# Patient Record
Sex: Female | Born: 1970 | Race: Black or African American | Hispanic: No | Marital: Married | Smoking: Never smoker
Health system: Southern US, Community
[De-identification: ages and names within clinical notes are randomized; demographics above are authoritative.]

## PROBLEM LIST (undated history)

## (undated) DIAGNOSIS — J45909 Unspecified asthma, uncomplicated: Secondary | ICD-10-CM

---

## 1998-08-25 ENCOUNTER — Emergency Department (HOSPITAL_COMMUNITY): Admission: EM | Admit: 1998-08-25 | Discharge: 1998-08-25 | Payer: Self-pay | Admitting: Endocrinology

## 1999-04-23 ENCOUNTER — Inpatient Hospital Stay (HOSPITAL_COMMUNITY): Admission: AD | Admit: 1999-04-23 | Discharge: 1999-04-23 | Payer: Self-pay | Admitting: Obstetrics & Gynecology

## 1999-04-23 ENCOUNTER — Encounter: Payer: Self-pay | Admitting: Obstetrics & Gynecology

## 1999-07-09 ENCOUNTER — Inpatient Hospital Stay (HOSPITAL_COMMUNITY): Admission: AD | Admit: 1999-07-09 | Discharge: 1999-07-09 | Payer: Self-pay | Admitting: Obstetrics and Gynecology

## 1999-10-01 ENCOUNTER — Inpatient Hospital Stay (HOSPITAL_COMMUNITY): Admission: AD | Admit: 1999-10-01 | Discharge: 1999-10-04 | Payer: Self-pay | Admitting: Obstetrics and Gynecology

## 2000-06-14 ENCOUNTER — Other Ambulatory Visit: Admission: RE | Admit: 2000-06-14 | Discharge: 2000-06-14 | Payer: Self-pay | Admitting: Internal Medicine

## 2001-08-15 ENCOUNTER — Other Ambulatory Visit: Admission: RE | Admit: 2001-08-15 | Discharge: 2001-08-15 | Payer: Self-pay | Admitting: Internal Medicine

## 2001-10-29 ENCOUNTER — Emergency Department (HOSPITAL_COMMUNITY): Admission: EM | Admit: 2001-10-29 | Discharge: 2001-10-29 | Payer: Self-pay | Admitting: Emergency Medicine

## 2003-03-28 ENCOUNTER — Other Ambulatory Visit: Admission: RE | Admit: 2003-03-28 | Discharge: 2003-03-28 | Payer: Self-pay | Admitting: Internal Medicine

## 2004-06-20 ENCOUNTER — Ambulatory Visit: Payer: Self-pay | Admitting: Endocrinology

## 2004-07-22 ENCOUNTER — Ambulatory Visit: Payer: Self-pay | Admitting: Internal Medicine

## 2004-08-06 ENCOUNTER — Ambulatory Visit: Payer: Self-pay | Admitting: Internal Medicine

## 2004-08-13 ENCOUNTER — Ambulatory Visit: Payer: Self-pay | Admitting: Internal Medicine

## 2004-08-19 ENCOUNTER — Other Ambulatory Visit: Admission: RE | Admit: 2004-08-19 | Discharge: 2004-08-19 | Payer: Self-pay | Admitting: Internal Medicine

## 2004-08-21 ENCOUNTER — Ambulatory Visit: Payer: Self-pay | Admitting: Internal Medicine

## 2004-10-02 ENCOUNTER — Ambulatory Visit: Payer: Self-pay | Admitting: Internal Medicine

## 2005-08-12 ENCOUNTER — Ambulatory Visit: Payer: Self-pay | Admitting: Internal Medicine

## 2007-04-07 ENCOUNTER — Encounter: Payer: Self-pay | Admitting: Internal Medicine

## 2013-08-23 ENCOUNTER — Encounter (HOSPITAL_COMMUNITY): Payer: Self-pay | Admitting: Emergency Medicine

## 2013-08-23 ENCOUNTER — Emergency Department (INDEPENDENT_AMBULATORY_CARE_PROVIDER_SITE_OTHER)
Admission: EM | Admit: 2013-08-23 | Discharge: 2013-08-23 | Disposition: A | Discharge status: Home / Self Care | Payer: BC Managed Care – PPO | Source: Home / Self Care | Attending: Family Medicine | Admitting: Family Medicine

## 2013-08-23 DIAGNOSIS — S161XXA Strain of muscle, fascia and tendon at neck level, initial encounter: Secondary | ICD-10-CM

## 2013-08-23 DIAGNOSIS — S139XXA Sprain of joints and ligaments of unspecified parts of neck, initial encounter: Secondary | ICD-10-CM

## 2013-08-23 MED ORDER — CYCLOBENZAPRINE HCL 5 MG PO TABS: 5.0000 mg | ORAL_TABLET | Freq: Every evening | ORAL | Status: DC | PRN

## 2013-08-23 MED ORDER — DICLOFENAC SODIUM 75 MG PO TBEC: 75.0000 mg | DELAYED_RELEASE_TABLET | Freq: Two times a day (BID) | ORAL | Status: DC | PRN
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
## 2013-08-28 NOTE — ED Notes (Signed)
Accessed record due to patient phone call

## 2013-09-04 ENCOUNTER — Ambulatory Visit: Payer: BC Managed Care – PPO | Attending: Family Medicine

## 2013-09-18 ENCOUNTER — Other Ambulatory Visit: Payer: Self-pay | Admitting: Family Medicine

## 2013-09-18 DIAGNOSIS — Z1231 Encounter for screening mammogram for malignant neoplasm of breast: Secondary | ICD-10-CM

## 2013-10-09 ENCOUNTER — Ambulatory Visit
Admission: RE | Admit: 2013-10-09 | Discharge: 2013-10-09 | Disposition: A | Discharge status: Home / Self Care | Payer: BC Managed Care – PPO | Source: Ambulatory Visit | Attending: Family Medicine | Admitting: Family Medicine

## 2013-10-09 DIAGNOSIS — Z1231 Encounter for screening mammogram for malignant neoplasm of breast: Secondary | ICD-10-CM

## 2013-10-17 ENCOUNTER — Encounter (HOSPITAL_COMMUNITY): Payer: Self-pay | Admitting: Emergency Medicine

## 2013-10-17 ENCOUNTER — Emergency Department (HOSPITAL_COMMUNITY)
Admission: EM | Admit: 2013-10-17 | Discharge: 2013-10-17 | Disposition: A | Discharge status: Home / Self Care | Payer: BC Managed Care – PPO | Attending: Emergency Medicine | Admitting: Emergency Medicine

## 2013-10-17 DIAGNOSIS — S0003XA Contusion of scalp, initial encounter: Secondary | ICD-10-CM | POA: Insufficient documentation

## 2013-10-17 DIAGNOSIS — Y92009 Unspecified place in unspecified non-institutional (private) residence as the place of occurrence of the external cause: Secondary | ICD-10-CM | POA: Insufficient documentation

## 2013-10-17 DIAGNOSIS — Y9389 Activity, other specified: Secondary | ICD-10-CM | POA: Insufficient documentation

## 2013-10-17 DIAGNOSIS — S0083XA Contusion of other part of head, initial encounter: Secondary | ICD-10-CM

## 2013-10-17 DIAGNOSIS — J45909 Unspecified asthma, uncomplicated: Secondary | ICD-10-CM | POA: Insufficient documentation

## 2013-10-17 DIAGNOSIS — W208XXA Other cause of strike by thrown, projected or falling object, initial encounter: Secondary | ICD-10-CM | POA: Insufficient documentation

## 2013-10-17 DIAGNOSIS — S1093XA Contusion of unspecified part of neck, initial encounter: Principal | ICD-10-CM

## 2013-10-17 HISTORY — DX: Unspecified asthma, uncomplicated: J45.909
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
## 2013-10-17 NOTE — ED Provider Notes (Signed)
CSN: [id number]     Arrival date & time 10/17/13  1925 History   First MD Initiated Contact with Patient 10/17/13 2034     Chief Complaint  Patient presents with  . Head Injury     (Consider location/radiation/quality/duration/timing/severity/associated sxs/prior Treatment) Patient is a 43 y.o. female presenting with head injury. The history is provided by the patient.  Head Injury  patient here after being struck on the left side of her face with a door. No loss of consciousness. Denies any nausea or vomiting. Denies any neck pain. No visual changes. Has not been confused since the accident. Does note mild swelling to her left zygomatic arch with an abrasion. No hearing issues. Pain characterized as sharp and worse with touch. No treatment used prior to arrival. Nothing makes her symptoms better.  Past Medical History  Diagnosis Date  . Asthma    History reviewed. No pertinent past surgical history. No family history on file. History  Substance Use Topics  . Smoking status: Never Smoker   . Smokeless tobacco: Not on file  . Alcohol Use: No   OB History   Grav Para Term Preterm Abortions TAB SAB Ect Mult Living                 Review of Systems  All other systems reviewed and are negative.      Allergies  Review of patient's allergies indicates no known allergies.  Home Medications  No current outpatient prescriptions on file. BP 111/73  Pulse 60  Temp(Src) 98.3 F (36.8 C) (Oral)  Resp 20  Ht 5' 6"  (1.676 m)  Wt 145 lb (65.772 kg)  BMI 23.41 kg/m2  SpO2 100%  LMP 09/24/2013 Physical Exam  Nursing note and vitals reviewed. Constitutional: She is oriented to person, place, and time. She appears well-developed and well-nourished.  Non-toxic appearance. No distress.  HENT:  Head: Normocephalic and atraumatic.    Eyes: Conjunctivae, EOM and lids are normal. Pupils are equal, round, and reactive to light.  Neck: Normal range of motion. Neck supple. No tracheal  deviation present. No mass present.  Cardiovascular: Normal rate, regular rhythm and normal heart sounds.  Exam reveals no gallop.   No murmur heard. Pulmonary/Chest: Effort normal and breath sounds normal. No stridor. No respiratory distress. She has no decreased breath sounds. She has no wheezes. She has no rhonchi. She has no rales.  Abdominal: Soft. Normal appearance and bowel sounds are normal. She exhibits no distension. There is no tenderness. There is no rebound and no CVA tenderness.  Musculoskeletal: Normal range of motion. She exhibits no edema and no tenderness.  Neurological: She is alert and oriented to person, place, and time. She has normal strength. No cranial nerve deficit or sensory deficit. GCS eye subscore is 4. GCS verbal subscore is 5. GCS motor subscore is 6.  Skin: Skin is warm and dry. No abrasion and no rash noted.  Psychiatric: She has a normal mood and affect. Her speech is normal and behavior is normal.    ED Course  Procedures (including critical care time) Labs Review Labs Reviewed - No data to display Imaging Review No results found.   EKG Interpretation None      MDM   Final diagnoses:  None    Patient without red flags for intracranial hemorrhage. Did discuss the possibility of doing a CT of her head which she has deferred. Stable for discharge    Leota Jacobsen, MD 10/17/13 2109

## 2013-10-17 NOTE — ED Notes (Signed)
Pt presents with c/o head injury. Pt says that she was working on some things in her house and a door fell onto her face. Pt says that she did not have any LOC, nausea, vomiting. Pt says she does not have any dizziness at this time. Pt does have some mild swelling to her left cheekbone at this time. Pt says she has had some trouble recalling her birthday today, is able to tell me but is slow on the recall.

## 2014-05-06 ENCOUNTER — Emergency Department (HOSPITAL_COMMUNITY): Payer: BC Managed Care – PPO

## 2014-05-06 ENCOUNTER — Encounter (HOSPITAL_COMMUNITY): Payer: Self-pay | Admitting: Emergency Medicine

## 2014-05-06 ENCOUNTER — Emergency Department (HOSPITAL_COMMUNITY)
Admission: EM | Admit: 2014-05-06 | Discharge: 2014-05-07 | Disposition: A | Discharge status: Home / Self Care | Payer: BC Managed Care – PPO | Attending: Emergency Medicine | Admitting: Emergency Medicine

## 2014-05-06 DIAGNOSIS — R5383 Other fatigue: Secondary | ICD-10-CM | POA: Diagnosis not present

## 2014-05-06 DIAGNOSIS — J45909 Unspecified asthma, uncomplicated: Secondary | ICD-10-CM | POA: Diagnosis not present

## 2014-05-06 DIAGNOSIS — H9209 Otalgia, unspecified ear: Secondary | ICD-10-CM | POA: Diagnosis not present

## 2014-05-06 DIAGNOSIS — J018 Other acute sinusitis: Secondary | ICD-10-CM | POA: Insufficient documentation

## 2014-05-06 DIAGNOSIS — M542 Cervicalgia: Secondary | ICD-10-CM | POA: Insufficient documentation

## 2014-05-06 DIAGNOSIS — Z79899 Other long term (current) drug therapy: Secondary | ICD-10-CM | POA: Insufficient documentation

## 2014-05-06 DIAGNOSIS — R05 Cough: Secondary | ICD-10-CM | POA: Diagnosis present

## 2014-05-06 DIAGNOSIS — R509 Fever, unspecified: Secondary | ICD-10-CM | POA: Insufficient documentation

## 2014-05-06 LAB — CBC WITH DIFFERENTIAL/PLATELET
Basophils Absolute: 0.1 10*3/uL (ref 0.0–0.1)
Basophils Relative: 1 % (ref 0–1)
EOS ABS: 0.1 10*3/uL (ref 0.0–0.7)
Eosinophils Relative: 2 % (ref 0–5)
HCT: 39.8 % (ref 36.0–46.0)
HEMOGLOBIN: 12.7 g/dL (ref 12.0–15.0)
Lymphocytes Relative: 30 % (ref 12–46)
Lymphs Abs: 1.6 10*3/uL (ref 0.7–4.0)
MCH: 28.4 pg (ref 26.0–34.0)
MCHC: 31.9 g/dL (ref 30.0–36.0)
MCV: 89 fL (ref 78.0–100.0)
MONO ABS: 0.5 10*3/uL (ref 0.1–1.0)
MONOS PCT: 9 % (ref 3–12)
Neutro Abs: 3.1 10*3/uL (ref 1.7–7.7)
Neutrophils Relative %: 58 % (ref 43–77)
Platelets: 347 10*3/uL (ref 150–400)
RBC: 4.47 MIL/uL (ref 3.87–5.11)
RDW: 13.3 % (ref 11.5–15.5)
WBC: 5.3 10*3/uL (ref 4.0–10.5)

## 2014-05-06 LAB — I-STAT CG4 LACTIC ACID, ED: Lactic Acid, Venous: 1.81 mmol/L (ref 0.5–2.2)

## 2014-05-06 LAB — COMPREHENSIVE METABOLIC PANEL
ALBUMIN: 4.2 g/dL (ref 3.5–5.2)
ALT: 15 U/L (ref 0–35)
ANION GAP: 12 (ref 5–15)
AST: 20 U/L (ref 0–37)
Alkaline Phosphatase: 48 U/L (ref 39–117)
BUN: 9 mg/dL (ref 6–23)
CALCIUM: 9.6 mg/dL (ref 8.4–10.5)
CO2: 25 mEq/L (ref 19–32)
CREATININE: 0.69 mg/dL (ref 0.50–1.10)
Chloride: 101 mEq/L (ref 96–112)
GFR calc Af Amer: >90 mL/min (ref >90)
GFR calc non Af Amer: >90 mL/min (ref >90)
Glucose, Bld: 97 mg/dL (ref 70–99)
Potassium: 3.9 mEq/L (ref 3.7–5.3)
Sodium: 138 mEq/L (ref 137–147)
TOTAL PROTEIN: 7.5 g/dL (ref 6.0–8.3)
Total Bilirubin: 0.2 mg/dL — ABNORMAL LOW (ref 0.3–1.2)

## 2014-05-06 MED ORDER — SODIUM CHLORIDE 0.9 % IV BOLUS (SEPSIS)
1000.0000 mL | Freq: Once | INTRAVENOUS | Status: AC
  Administered 2014-05-06: 1000 mL via INTRAVENOUS

## 2014-05-06 MED ORDER — HYDROCOD POLST-CHLORPHEN POLST 10-8 MG/5ML PO LQCR
5.0000 mL | Freq: Once | ORAL | Status: AC
  Administered 2014-05-07: 5 mL via ORAL
  Filled 2014-05-06: qty 5

## 2014-05-06 MED ORDER — PSEUDOEPHEDRINE HCL ER 120 MG PO TB12: 120.0000 mg | ORAL_TABLET | Freq: Two times a day (BID) | ORAL | Status: DC

## 2014-05-06 MED ORDER — HYDROCOD POLST-CHLORPHEN POLST 10-8 MG/5ML PO LQCR: 5.0000 mL | Freq: Two times a day (BID) | ORAL | Status: DC | PRN

## 2014-05-06 MED ORDER — MORPHINE SULFATE 4 MG/ML IJ SOLN
4.0000 mg | Freq: Once | INTRAMUSCULAR | Status: AC
Start: 2014-05-06 — End: 2014-05-06
  Administered 2014-05-06: 4 mg via INTRAVENOUS
  Filled 2014-05-06: qty 1

## 2014-05-06 NOTE — ED Provider Notes (Signed)
CSN: [id number]     Arrival date & time 05/06/14  1959 History   First MD Initiated Contact with Patient 05/06/14 2122     Chief Complaint  Patient presents with  . Neck Pain  . URI     (Consider location/radiation/quality/duration/timing/severity/associated sxs/prior Treatment) HPI Comments: Patient is a 43 year old female with history of asthma who presents the emergency department evaluation of congestion and generalized malaise over the past week. She reports her symtpoms are gradually worsening. She has associated productive cough which is worse at night. She reports fever of 54F on Friday. She takes NyQuil which helped her go to sleep at night. Her last dose of the medication was yesterday. She has associated decreased appetite, ear pain, and hoarse voice. She has associated neck pain which is worse with movement. She denies headache, visual disturbance, photophobia, rash.   The history is provided by the patient. No language interpreter was used.    Past Medical History  Diagnosis Date  . Asthma    Past Surgical History  Procedure Laterality Date  . Cesarean section     No family history on file. History  Substance Use Topics  . Smoking status: Never Smoker   . Smokeless tobacco: Not on file  . Alcohol Use: No   OB History   Grav Para Term Preterm Abortions TAB SAB Ect Mult Living                 Review of Systems  Constitutional: Positive for fever, chills, appetite change and fatigue.  HENT: Positive for congestion, ear pain and rhinorrhea. Negative for sore throat.   Eyes: Negative for photophobia and visual disturbance.  Respiratory: Positive for cough. Negative for shortness of breath.   Cardiovascular: Negative for chest pain.  Musculoskeletal: Positive for myalgias and neck pain.  Neurological: Negative for headaches.  All other systems reviewed and are negative.     Allergies  Review of patient's allergies indicates no known allergies.  Home  Medications   Prior to Admission medications   Medication Sig Start Date End Date Taking? Authorizing Provider  albuterol (PROVENTIL HFA;VENTOLIN HFA) 108 (90 BASE) MCG/ACT inhaler Inhale 1 puff into the lungs every 6 (six) hours as needed for wheezing or shortness of breath.   Yes Historical Provider, MD   BP 122/70  Pulse 78  Temp(Src) 98.1 F (36.7 C) (Oral)  Resp 20  Ht 5' 5"  (1.651 m)  Wt 150 lb (68.04 kg)  BMI 24.96 kg/m2  SpO2 99%  LMP 04/30/2014 Physical Exam  Nursing note and vitals reviewed. Constitutional: She is oriented to person, place, and time. She appears well-developed and well-nourished. No distress.  HENT:  Head: Normocephalic and atraumatic.  Right Ear: Tympanic membrane, external ear and ear canal normal. No mastoid tenderness.  Left Ear: Tympanic membrane, external ear and ear canal normal. No mastoid tenderness.  Nose: Right sinus exhibits maxillary sinus tenderness and frontal sinus tenderness. Left sinus exhibits maxillary sinus tenderness and frontal sinus tenderness.  Mouth/Throat: Uvula is midline, oropharynx is clear and moist and mucous membranes are normal.  No mastoid tenderness  Eyes: Conjunctivae and EOM are normal. Pupils are equal, round, and reactive to light.  Neck: Normal range of motion. No rigidity.  No nuchal rigidity or meningeal signs. Patient reports feeling pain, but freely moves head and neck around in circles.   Cardiovascular: Normal rate, regular rhythm and normal heart sounds.   Pulmonary/Chest: Effort normal and breath sounds normal. No stridor. No respiratory  distress. She has no wheezes. She has no rales.  Abdominal: Soft. She exhibits no distension. There is no tenderness.  Musculoskeletal: Normal range of motion.  Moves all extremities without guarding or ataxia.   Lymphadenopathy:    She has cervical adenopathy.       Right cervical: Superficial cervical and posterior cervical adenopathy present.       Left cervical:  Superficial cervical and posterior cervical adenopathy present.  Neurological: She is alert and oriented to person, place, and time. She has normal strength. Coordination and gait normal. GCS eye subscore is 4. GCS verbal subscore is 5. GCS motor subscore is 6.  Skin: Skin is warm and dry. She is not diaphoretic. No erythema.  Psychiatric: She has a normal mood and affect. Her behavior is normal.    ED Course  Procedures (including critical care time) Labs Review Labs Reviewed  COMPREHENSIVE METABOLIC PANEL - Abnormal; Notable for the following:    Total Bilirubin 0.2 (*)    All other components within normal limits  CBC WITH DIFFERENTIAL  I-STAT CG4 LACTIC ACID, ED    Imaging Review Dg Chest 2 View  05/06/2014   CLINICAL DATA:  Neck pain and URI.  Initial encounter  EXAM: CHEST  2 VIEW  COMPARISON:  None.  FINDINGS: Normal heart size and mediastinal contours. No acute infiltrate or edema. No effusion or pneumothorax. No acute osseous findings.  IMPRESSION: No active cardiopulmonary disease.   Electronically Signed   By: Jorje Guild M.D.   On: 05/06/2014 22:33     EKG Interpretation None      MDM   Final diagnoses:  Other acute sinusitis   Patient complaining of symptoms of sinusitis.  Mild to moderate symptoms of clear/yellow nasal discharge/congestion and scratchy throat with cough for less than 10 days.  Patient is afebrile.  No concern for acute bacterial rhinosinusitis; likely viral in nature.  Patient discharged with symptomatic treatment.  Patient instructions given for warm saline nasal washes.  Recommendations for follow-up with primary care physician.    Elwyn Lade, PA-C 05/08/14 [phone]

## 2014-05-06 NOTE — ED Notes (Signed)
Pt states that she has had congestion and not feeling well x 7 days; pt states that Friday she had a fever and began to have neck pain; pt able to bend head down but states it is painful; pt c/o generalized malaise; decreased appetite; congestion; ear pain; and hoarseness; pt states that she has been taking OTC medications without improvement of symptos

## 2014-05-06 NOTE — Discharge Instructions (Signed)
Sinusitis °Sinusitis is redness, soreness, and puffiness (inflammation) of the air pockets in the bones of your face (sinuses). The redness, soreness, and puffiness can cause air and mucus to get trapped in your sinuses. This can allow germs to grow and cause an infection.  °HOME CARE  °· Drink enough fluids to keep your pee (urine) clear or pale yellow. °· Use a humidifier in your home. °· Run a hot shower to create steam in the bathroom. Sit in the bathroom with the door closed. Breathe in the steam 3-4 times a day. °· Put a warm, moist washcloth on your face 3-4 times a day, or as told by your doctor. °· Use salt water sprays (saline sprays) to wet the thick fluid in your nose. This can help the sinuses drain. °· Only take medicine as told by your doctor. °GET HELP RIGHT AWAY IF:  °· Your pain gets worse. °· You have very bad headaches. °· You are sick to your stomach (nauseous). °· You throw up (vomit). °· You are very sleepy (drowsy) all the time. °· Your face is puffy (swollen). °· Your vision changes. °· You have a stiff neck. °· You have trouble breathing. °MAKE SURE YOU:  °· Understand these instructions. °· Will watch your condition. °· Will get help right away if you are not doing well or get worse. °Document Released: 12/30/2007 Document Revised: 04/06/2012 Document Reviewed: 02/16/2012 °[employer]® Patient Information ©2015 [employer]. This information is not intended to replace advice given to you by your health care provider. Make sure you discuss any questions you have with your health care provider. ° °

## 2014-05-06 NOTE — ED Notes (Signed)
Bed: UV75 Expected date:  Expected time:  Means of arrival:  Comments: Hold for [facility]

## 2014-05-07 MED ORDER — DEXAMETHASONE SODIUM PHOSPHATE 10 MG/ML IJ SOLN
10.0000 mg | Freq: Once | INTRAMUSCULAR | Status: AC
  Administered 2014-05-07: 10 mg via INTRAVENOUS
  Filled 2014-05-07: qty 1

## 2014-05-07 NOTE — ED Notes (Signed)
Patient is alert and oriented x3.  She was given DC instructions and follow up visit instructions.  Patient gave verbal understanding. She was DC ambulatory under her own power to home.  V/S stable.  He was not showing any signs of distress on DC 

## 2014-05-08 NOTE — ED Provider Notes (Signed)
Medical screening examination/treatment/procedure(s) were performed by non-physician practitioner and as supervising physician I was immediately available for consultation/collaboration.   EKG Interpretation None        Wandra Arthurs, MD 05/08/14 [phone]

## 2016-08-10 ENCOUNTER — Other Ambulatory Visit: Payer: Self-pay | Admitting: Family Medicine

## 2016-08-10 DIAGNOSIS — Z1231 Encounter for screening mammogram for malignant neoplasm of breast: Secondary | ICD-10-CM

## 2016-08-20 ENCOUNTER — Ambulatory Visit: Payer: BC Managed Care – PPO

## 2016-08-28 ENCOUNTER — Ambulatory Visit: Payer: BC Managed Care – PPO

## 2017-02-11 ENCOUNTER — Ambulatory Visit
Admission: RE | Admit: 2017-02-11 | Discharge: 2017-02-11 | Disposition: A | Discharge status: Home / Self Care | Payer: BC Managed Care – PPO | Source: Ambulatory Visit | Attending: Family Medicine | Admitting: Family Medicine

## 2017-02-11 DIAGNOSIS — Z1231 Encounter for screening mammogram for malignant neoplasm of breast: Secondary | ICD-10-CM

## 2018-01-24 ENCOUNTER — Other Ambulatory Visit: Payer: Self-pay

## 2018-01-24 ENCOUNTER — Emergency Department (HOSPITAL_COMMUNITY)
Admission: EM | Admit: 2018-01-24 | Discharge: 2018-01-24 | Disposition: A | Discharge status: Home / Self Care | Payer: BC Managed Care – PPO | Attending: Emergency Medicine | Admitting: Emergency Medicine

## 2018-01-24 ENCOUNTER — Encounter (HOSPITAL_COMMUNITY): Payer: Self-pay | Admitting: Emergency Medicine

## 2018-01-24 DIAGNOSIS — Z79899 Other long term (current) drug therapy: Secondary | ICD-10-CM | POA: Insufficient documentation

## 2018-01-24 DIAGNOSIS — J45909 Unspecified asthma, uncomplicated: Secondary | ICD-10-CM | POA: Diagnosis not present

## 2018-01-24 DIAGNOSIS — T782XXA Anaphylactic shock, unspecified, initial encounter: Secondary | ICD-10-CM

## 2018-01-24 LAB — BASIC METABOLIC PANEL
ANION GAP: 8 (ref 5–15)
BUN: 7 mg/dL (ref 6–20)
CO2: 24 mmol/L (ref 22–32)
Calcium: 9.1 mg/dL (ref 8.9–10.3)
Chloride: 108 mmol/L (ref 98–111)
Creatinine, Ser: 0.92 mg/dL (ref 0.44–1.00)
GFR calc Af Amer: >60 mL/min (ref >60)
GFR calc non Af Amer: >60 mL/min (ref >60)
GLUCOSE: 90 mg/dL (ref 70–99)
Potassium: 3.9 mmol/L (ref 3.5–5.1)
Sodium: 140 mmol/L (ref 135–145)

## 2018-01-24 LAB — CBC
HEMATOCRIT: 41.4 % (ref 36.0–46.0)
Hemoglobin: 12.4 g/dL (ref 12.0–15.0)
MCH: 27.7 pg (ref 26.0–34.0)
MCHC: 30 g/dL (ref 30.0–36.0)
MCV: 92.6 fL (ref 78.0–100.0)
Platelets: 316 10*3/uL (ref 150–400)
RBC: 4.47 MIL/uL (ref 3.87–5.11)
RDW: 14.1 % (ref 11.5–15.5)
WBC: 4.6 10*3/uL (ref 4.0–10.5)

## 2018-01-24 MED ORDER — PREDNISONE 20 MG PO TABS: ORAL_TABLET | ORAL | 0 refills | Status: DC

## 2018-01-24 MED ORDER — DIPHENHYDRAMINE HCL 50 MG/ML IJ SOLN
25.0000 mg | Freq: Once | INTRAMUSCULAR | Status: AC
  Administered 2018-01-24: 25 mg via INTRAVENOUS
  Filled 2018-01-24: qty 1

## 2018-01-24 MED ORDER — EPINEPHRINE 0.3 MG/0.3ML IJ SOAJ: 0.3000 mg | Freq: Once | INTRAMUSCULAR | 2 refills | Status: AC

## 2018-01-24 MED ORDER — EPINEPHRINE 0.3 MG/0.3ML IJ SOAJ
INTRAMUSCULAR | Status: AC
  Administered 2018-01-24: 0.3 mg
  Filled 2018-01-24: qty 0.3

## 2018-01-24 MED ORDER — METHYLPREDNISOLONE SODIUM SUCC 125 MG IJ SOLR
125.0000 mg | Freq: Once | INTRAMUSCULAR | Status: AC
  Administered 2018-01-24: 125 mg via INTRAVENOUS
  Filled 2018-01-24: qty 2

## 2018-01-24 MED ORDER — FAMOTIDINE IN NACL 20-0.9 MG/50ML-% IV SOLN
20.0000 mg | Freq: Once | INTRAVENOUS | Status: AC
  Administered 2018-01-24: 20 mg via INTRAVENOUS
  Filled 2018-01-24: qty 50

## 2018-01-24 MED ORDER — SODIUM CHLORIDE 0.9 % IV BOLUS
1000.0000 mL | Freq: Once | INTRAVENOUS | Status: AC
Start: 2018-01-24 — End: 2018-01-24
  Administered 2018-01-24: 1000 mL via INTRAVENOUS

## 2018-01-24 NOTE — ED Notes (Signed)
Discharge instructions reviewed with patient and prescriptions reviewed. Pt instructed on use of an EPI pen and after care. Pt denied any further requests/questions. Signature pad unavailable, pt verbalized understanding.

## 2018-01-24 NOTE — ED Notes (Signed)
ED Provider at bedside. 

## 2018-01-24 NOTE — ED Provider Notes (Signed)
[facility] EMERGENCY DEPARTMENT Provider Note   CSN: [id number] Arrival date & time: 01/24/18  0130     History   Chief Complaint Chief Complaint  Patient presents with  . Allergic Reaction    HPI Christina Schmidt is a 47 y.o. female.  Patient presents to the emergency department for evaluation of allergic reaction.  Patient has a history of shellfish allergy.  She ate at [employer] and thinks that the food was contaminated with shrimp.  She started having swelling of her lips and tongue, shortness of breath, heaviness and tightness in her chest.  She feels like her throat is closing.  She took Benadryl 50 mg p.o. prior to arrival.  She has not had improvement.     Past Medical History:  Diagnosis Date  . Asthma     There are no active problems to display for this patient.   Past Surgical History:  Procedure Laterality Date  . CESAREAN SECTION       OB History   None      Home Medications    Prior to Admission medications   Medication Sig Start Date End Date Taking? Authorizing Provider  albuterol (PROVENTIL HFA;VENTOLIN HFA) 108 (90 BASE) MCG/ACT inhaler Inhale 1-2 puffs into the lungs every 6 (six) hours as needed for wheezing or shortness of breath.    Yes [provider]  diphenhydrAMINE (BENADRYL) 25 mg capsule Take 25-50 mg by mouth as needed (for allergies or allergic reactions).    Yes [provider]  chlorpheniramine-HYDROcodone (TUSSIONEX PENNKINETIC ER) 10-8 MG/5ML LQCR Take 5 mLs by mouth every 12 (twelve) hours as needed for cough (Cough). Patient not taking: Reported on 01/24/2018 05/06/14   Cleatrice Burke, PA-C  EPINEPHrine (EPIPEN 2-PAK) 0.3 mg/0.3 mL IJ SOAJ injection Inject 0.3 mLs (0.3 mg total) into the muscle once for 1 dose. 01/24/18 01/24/18  Orpah Greek, MD  predniSONE (DELTASONE) 20 MG tablet 3 tabs po daily x 3 days, then 2 tabs x 3 days, then 1.5 tabs x 3 days, then 1 tab x 3 days, then  0.5 tabs x 3 days 01/24/18   Orpah Greek, MD  pseudoephedrine (SUDAFED 12 HOUR) 120 MG 12 hr tablet Take 1 tablet (120 mg total) by mouth 2 (two) times daily. Patient not taking: Reported on 01/24/2018 05/06/14   Cleatrice Burke, PA-C    Family History Family History  Problem Relation Age of Onset  . Breast cancer Mother 68    Social History Social History   Tobacco Use  . Smoking status: Never Smoker  . Smokeless tobacco: Never Used  Substance Use Topics  . Alcohol use: No  . Drug use: No     Allergies   Shellfish-derived products   Review of Systems Review of Systems  HENT: Positive for facial swelling and trouble swallowing.   Respiratory: Positive for chest tightness and shortness of breath.   All other systems reviewed and are negative.    Physical Exam Updated Vital Signs BP 111/76   Pulse 85   Temp 97.8 F (36.6 C) (Oral)   Resp 16   Ht 5' 5"  (1.651 m)   Wt 72.6 kg (160 lb)   SpO2 97%   BMI 26.63 kg/m   Physical Exam  Constitutional: She is oriented to person, place, and time. She appears well-developed and well-nourished. No distress.  HENT:  Head: Normocephalic and atraumatic.  Right Ear: Hearing normal.  Left Ear: Hearing normal.  Nose: Nose  normal.  Mouth/Throat: Oropharynx is clear and moist and mucous membranes are normal.  Eyes: Pupils are equal, round, and reactive to light. Conjunctivae and EOM are normal.  Neck: Normal range of motion. Neck supple.  Cardiovascular: Regular rhythm, S1 normal and S2 normal. Exam reveals no gallop and no friction rub.  No murmur heard. Pulmonary/Chest: Effort normal and breath sounds normal. No respiratory distress. She exhibits no tenderness.  Abdominal: Soft. Normal appearance and bowel sounds are normal. There is no hepatosplenomegaly. There is no tenderness. There is no rebound, no guarding, no tenderness at McBurney's point and negative Murphy's sign. No hernia.  Musculoskeletal: Normal range of  motion.  Neurological: She is alert and oriented to person, place, and time. She has normal strength. No cranial nerve deficit or sensory deficit. Coordination normal. GCS eye subscore is 4. GCS verbal subscore is 5. GCS motor subscore is 6.  Skin: Skin is warm, dry and intact. No rash noted. No cyanosis.  Psychiatric: She has a normal mood and affect. Her speech is normal and behavior is normal. Thought content normal.  Nursing note and vitals reviewed.    ED Treatments / Results  Labs (all labs ordered are listed, but only abnormal results are displayed) Labs Reviewed  CBC  BASIC METABOLIC PANEL    EKG None  Radiology No results found.  Procedures Procedures (including critical care time)  Medications Ordered in ED Medications  EPINEPHrine (EPI-PEN) 0.3 mg/0.3 mL injection (0.3 mg  Given 01/24/18 0148)  sodium chloride 0.9 % bolus 1,000 mL (0 mLs Intravenous Stopped 01/24/18 0551)  methylPREDNISolone sodium succinate (SOLU-MEDROL) 125 mg/2 mL injection 125 mg (125 mg Intravenous Given 01/24/18 0244)  diphenhydrAMINE (BENADRYL) injection 25 mg (25 mg Intravenous Given 01/24/18 0243)  famotidine (PEPCID) IVPB 20 mg premix (0 mg Intravenous Stopped 01/24/18 0354)     Initial Impression / Assessment and Plan / ED Course  I have reviewed the triage vital signs and the nursing notes.  Pertinent labs & imaging results that were available during my care of the patient were reviewed by me and considered in my medical decision making (see chart for details).     Patient presents to the emergency department for evaluation of allergic reaction.  Patient has an allergy to shrimp, thinks that her food tonight was contaminated by shrimp.  She presented with tongue and throat swelling, chest tightness, difficulty breathing.  She was administered epinephrine and monitored for an extended period of time.  Symptoms have resolved, patient has done well through the.  Of evaluation.  She will be  appropriate for discharge with prednisone taper, antihistamines.  Will provide prescription for EpiPen.  Given return precautions.  CRITICAL CARE Performed by: Orpah Greek   Total critical care time: 30 minutes  Critical care time was exclusive of separately billable procedures and treating other patients.  Critical care was necessary to treat or prevent imminent or life-threatening deterioration.  Critical care was time spent personally by me on the following activities: development of treatment plan with patient and/or surrogate as well as nursing, discussions with consultants, evaluation of patient's response to treatment, examination of patient, obtaining history from patient or surrogate, ordering and performing treatments and interventions, ordering and review of laboratory studies, ordering and review of radiographic studies, pulse oximetry and re-evaluation of patient's condition.   Final Clinical Impressions(s) / ED Diagnoses   Final diagnoses:  Anaphylaxis, initial encounter    ED Discharge Orders        Ordered  predniSONE (DELTASONE) 20 MG tablet     01/24/18 0612    EPINEPHrine (EPIPEN 2-PAK) 0.3 mg/0.3 mL IJ SOAJ injection   Once     01/24/18 0612       Orpah Greek, MD 01/24/18 [phone]

## 2018-01-24 NOTE — ED Triage Notes (Signed)
Pt has known allergy to shellfish, pt was unaware shrimp was in food she ate about 2200. Pt states chest is heavy with lips tingling, throat "closing". Pt did take Benadryl x 2 but did not use her Epi pen.

## 2018-02-11 ENCOUNTER — Other Ambulatory Visit: Payer: Self-pay | Admitting: Family Medicine

## 2018-02-11 DIAGNOSIS — Z1231 Encounter for screening mammogram for malignant neoplasm of breast: Secondary | ICD-10-CM

## 2018-03-03 ENCOUNTER — Ambulatory Visit
Admission: RE | Admit: 2018-03-03 | Discharge: 2018-03-03 | Disposition: A | Discharge status: Home / Self Care | Payer: BC Managed Care – PPO | Source: Ambulatory Visit | Attending: Family Medicine | Admitting: Family Medicine

## 2018-03-03 DIAGNOSIS — Z1231 Encounter for screening mammogram for malignant neoplasm of breast: Secondary | ICD-10-CM

## 2019-01-23 ENCOUNTER — Other Ambulatory Visit: Payer: Self-pay | Admitting: Family Medicine

## 2019-01-23 DIAGNOSIS — Z1231 Encounter for screening mammogram for malignant neoplasm of breast: Secondary | ICD-10-CM

## 2019-03-06 ENCOUNTER — Other Ambulatory Visit: Payer: Self-pay

## 2019-03-06 ENCOUNTER — Ambulatory Visit
Admission: RE | Admit: 2019-03-06 | Discharge: 2019-03-06 | Disposition: A | Discharge status: Home / Self Care | Payer: BC Managed Care – PPO | Source: Ambulatory Visit | Attending: Family Medicine | Admitting: Family Medicine

## 2019-03-06 DIAGNOSIS — Z1231 Encounter for screening mammogram for malignant neoplasm of breast: Secondary | ICD-10-CM

## 2020-01-30 ENCOUNTER — Other Ambulatory Visit: Payer: Self-pay | Admitting: Family Medicine

## 2020-01-30 DIAGNOSIS — Z1231 Encounter for screening mammogram for malignant neoplasm of breast: Secondary | ICD-10-CM

## 2020-03-06 ENCOUNTER — Ambulatory Visit
Admission: RE | Admit: 2020-03-06 | Discharge: 2020-03-06 | Disposition: A | Discharge status: Home / Self Care | Payer: BC Managed Care – PPO | Source: Ambulatory Visit | Attending: Family Medicine | Admitting: Family Medicine

## 2020-03-06 ENCOUNTER — Other Ambulatory Visit: Payer: Self-pay

## 2020-03-06 DIAGNOSIS — Z1231 Encounter for screening mammogram for malignant neoplasm of breast: Secondary | ICD-10-CM

## 2020-12-26 LAB — COLOGUARD: COLOGUARD: NEGATIVE

## 2021-02-03 ENCOUNTER — Other Ambulatory Visit: Payer: Self-pay | Admitting: Family Medicine

## 2021-02-03 DIAGNOSIS — Z1231 Encounter for screening mammogram for malignant neoplasm of breast: Secondary | ICD-10-CM

## 2021-03-07 ENCOUNTER — Other Ambulatory Visit: Payer: Self-pay

## 2021-03-07 ENCOUNTER — Ambulatory Visit
Admission: RE | Admit: 2021-03-07 | Discharge: 2021-03-07 | Disposition: A | Discharge status: Home / Self Care | Payer: Self-pay | Source: Ambulatory Visit | Attending: Family Medicine | Admitting: Family Medicine

## 2021-03-07 ENCOUNTER — Other Ambulatory Visit: Payer: Self-pay | Admitting: Family Medicine

## 2021-03-07 DIAGNOSIS — Z1231 Encounter for screening mammogram for malignant neoplasm of breast: Secondary | ICD-10-CM

## 2021-03-27 ENCOUNTER — Ambulatory Visit: Payer: BC Managed Care – PPO

## 2021-04-26 ENCOUNTER — Emergency Department (HOSPITAL_COMMUNITY)
Admission: EM | Admit: 2021-04-26 | Discharge: 2021-04-26 | Disposition: A | Discharge status: Home / Self Care | Payer: BC Managed Care – PPO | Attending: Emergency Medicine | Admitting: Emergency Medicine

## 2021-04-26 ENCOUNTER — Emergency Department (HOSPITAL_COMMUNITY): Payer: BC Managed Care – PPO

## 2021-04-26 ENCOUNTER — Encounter (HOSPITAL_COMMUNITY): Payer: Self-pay

## 2021-04-26 ENCOUNTER — Other Ambulatory Visit: Payer: Self-pay

## 2021-04-26 DIAGNOSIS — R0789 Other chest pain: Secondary | ICD-10-CM | POA: Insufficient documentation

## 2021-04-26 DIAGNOSIS — M542 Cervicalgia: Secondary | ICD-10-CM | POA: Diagnosis present

## 2021-04-26 DIAGNOSIS — J45909 Unspecified asthma, uncomplicated: Secondary | ICD-10-CM | POA: Diagnosis not present

## 2021-04-26 DIAGNOSIS — M79602 Pain in left arm: Secondary | ICD-10-CM | POA: Insufficient documentation

## 2021-04-26 DIAGNOSIS — R102 Pelvic and perineal pain: Secondary | ICD-10-CM | POA: Insufficient documentation

## 2021-04-26 DIAGNOSIS — R200 Anesthesia of skin: Secondary | ICD-10-CM | POA: Diagnosis not present

## 2021-04-26 DIAGNOSIS — Z7982 Long term (current) use of aspirin: Secondary | ICD-10-CM | POA: Diagnosis not present

## 2021-04-26 DIAGNOSIS — R6884 Jaw pain: Secondary | ICD-10-CM | POA: Insufficient documentation

## 2021-04-26 LAB — BASIC METABOLIC PANEL
Anion gap: 9 (ref 5–15)
BUN: 6 mg/dL (ref 6–20)
CO2: 25 mmol/L (ref 22–32)
Calcium: 9 mg/dL (ref 8.9–10.3)
Chloride: 105 mmol/L (ref 98–111)
Creatinine, Ser: 0.71 mg/dL (ref 0.44–1.00)
GFR, Estimated: >60 mL/min (ref >60)
Glucose, Bld: 99 mg/dL (ref 70–99)
Potassium: 4.1 mmol/L (ref 3.5–5.1)
Sodium: 139 mmol/L (ref 135–145)

## 2021-04-26 LAB — TROPONIN I (HIGH SENSITIVITY)
Troponin I (High Sensitivity): 2 ng/L (ref <18)
Troponin I (High Sensitivity): 2 ng/L (ref <18)

## 2021-04-26 LAB — CBC
HCT: 40.7 % (ref 36.0–46.0)
Hemoglobin: 12.4 g/dL (ref 12.0–15.0)
MCH: 27.4 pg (ref 26.0–34.0)
MCHC: 30.5 g/dL (ref 30.0–36.0)
MCV: 90 fL (ref 80.0–100.0)
Platelets: 265 10*3/uL (ref 150–400)
RBC: 4.52 MIL/uL (ref 3.87–5.11)
RDW: 14.4 % (ref 11.5–15.5)
WBC: 2.8 10*3/uL — ABNORMAL LOW (ref 4.0–10.5)
nRBC: 0 % (ref 0.0–0.2)

## 2021-04-26 LAB — HCG, QUANTITATIVE, PREGNANCY: hCG, Beta Chain, Quant, S: <1 m[IU]/mL (ref <5)

## 2021-04-26 MED ORDER — METHOCARBAMOL 500 MG PO TABS: 500.0000 mg | ORAL_TABLET | Freq: Two times a day (BID) | ORAL | 0 refills | Status: DC

## 2021-04-26 NOTE — ED Triage Notes (Signed)
Pt arrived via POV, states she started yesterday morning with neck pain, woke this morning with intermittent sternal chest pain, radiating to left arm and jaw, non reproducible. Took 81 ASA x2 and ibuprofen PTA .

## 2021-04-26 NOTE — ED Provider Notes (Signed)
[facility] DEPT Provider Note   CSN: [id number] Arrival date & time: 04/26/21  1053     History Chief Complaint  Patient presents with   Chest Pain    Christina Schmidt is a 50 y.o. female.  She has a past medical history of asthma. Patient presents to the emergency department with complaints of chest pain. States that yesterday morning she woke up with severe neck pain on the left side.  Says that it hurts worse with moving her neck.  She describes it as throbbing.  It is constant.  She has shooting pains one time down her left arm.  And some associated tingling.  She also had some radiating pain to her left jaw.  This morning she woke up with some mild dull chest pain in the center of her chest.  This pain was present at rest. No radiation.   She took some Tums, aspirin, ibuprofen and the chest pain improved.  She denies any shortness of breath, headache, vision changes, current chest pain, abdominal pain, nausea, vomiting, weakness or numbness in her extremities.  Currently, patient only has pain in the left side of her neck with no chest pain any longer.   Chest Pain Associated symptoms: no abdominal pain, no back pain, no cough, no dizziness, no fever, no headache, no nausea, no numbness, no palpitations, no shortness of breath, no vomiting and no weakness    HPI: A 50 year old patient presents for evaluation of chest pain. Initial onset of pain was more than 6 hours ago. The patient's chest pain is well-localized and is not worse with exertion. The patient's chest pain is middle- or left-sided, is not described as heaviness/pressure/tightness, is not sharp and does not radiate to the arms/jaw/neck. The patient does not complain of nausea and denies diaphoresis. The patient has no history of stroke, has no history of peripheral artery disease, has not smoked in the past 90 days, denies any history of treated diabetes, has no relevant family history of coronary  artery disease (first degree relative at less than age 55), is not hypertensive, has no history of hypercholesterolemia and does not have an elevated BMI (>=30).   Past Medical History:  Diagnosis Date   Asthma     There are no problems to display for this patient.   Past Surgical History:  Procedure Laterality Date   CESAREAN SECTION       OB History   No obstetric history on file.     Family History  Problem Relation Age of Onset   Breast cancer Mother 59    Social History   Tobacco Use   Smoking status: Never   Smokeless tobacco: Never  Substance Use Topics   Alcohol use: No   Drug use: No    Home Medications Prior to Admission medications   Medication Sig Start Date End Date Taking? Authorizing Provider  albuterol (PROVENTIL HFA;VENTOLIN HFA) 108 (90 BASE) MCG/ACT inhaler Inhale 1-2 puffs into the lungs every 6 (six) hours as needed for wheezing or shortness of breath.    Yes [provider]  aspirin EC 81 MG tablet Take 162 mg by mouth daily. Swallow whole.   Yes [provider]  calcium carbonate (TUMS - DOSED IN MG ELEMENTAL CALCIUM) 500 MG chewable tablet Chew 2 tablets by mouth 2 (two) times daily as needed for indigestion or heartburn.   Yes [provider]  EPINEPHrine 0.3 mg/0.3 mL IJ SOAJ injection Inject 0.3 mg into the muscle  as needed for anaphylaxis. 12/25/20  Yes [provider]  ibuprofen (ADVIL) 800 MG tablet Take 800 mg by mouth every 8 (eight) hours as needed for mild pain.   Yes [provider]  methocarbamol (ROBAXIN) 500 MG tablet Take 1 tablet (500 mg total) by mouth 2 (two) times daily. 04/26/21  Yes Cayne Yom, Adora Fridge, PA-C  tretinoin (RETIN-A) 0.05 % cream Apply 1 application topically at bedtime as needed (acne). 02/04/21  Yes [provider]    Allergies    Shellfish-derived products  Review of Systems   Review of Systems  Constitutional:  Negative for chills and fever.  HENT:   Negative for congestion and rhinorrhea.   Eyes:  Negative for visual disturbance.  Respiratory:  Negative for cough, chest tightness and shortness of breath.   Cardiovascular:  Positive for chest pain. Negative for palpitations and leg swelling.  Gastrointestinal:  Negative for abdominal pain, constipation, diarrhea, nausea and vomiting.  Genitourinary:  Negative for difficulty urinating.  Musculoskeletal:  Positive for neck pain. Negative for back pain.  Skin:  Negative for rash and wound.  Neurological:  Negative for dizziness, syncope, weakness, light-headedness, numbness and headaches.  Psychiatric/Behavioral:  Negative for confusion.   All other systems reviewed and are negative.  Physical Exam Updated Vital Signs BP (!) 135/92   Pulse (!) 56   Temp 98 F (36.7 C) (Oral)   Resp 13   LMP 04/12/2021   SpO2 100%   Physical Exam Vitals and nursing note reviewed.  Constitutional:      General: She is not in acute distress.    Appearance: Normal appearance. She is not ill-appearing, toxic-appearing or diaphoretic.  HENT:     Head: Normocephalic and atraumatic.     Right Ear: Tympanic membrane normal.     Left Ear: Tympanic membrane normal.     Nose: Nose normal. No nasal deformity.     Mouth/Throat:     Lips: Pink. No lesions.     Mouth: Mucous membranes are moist. No injury, lacerations, oral lesions or angioedema.     Pharynx: Oropharynx is clear. Uvula midline. No pharyngeal swelling, oropharyngeal exudate, posterior oropharyngeal erythema or uvula swelling.  Eyes:     General: Gaze aligned appropriately. No scleral icterus.       Right eye: No discharge.        Left eye: No discharge.     Extraocular Movements: Extraocular movements intact.     Conjunctiva/sclera: Conjunctivae normal.     Right eye: Right conjunctiva is not injected. No exudate or hemorrhage.    Left eye: Left conjunctiva is not injected. No exudate or hemorrhage.    Pupils: Pupils are equal, round, and  reactive to light.  Cardiovascular:     Rate and Rhythm: Normal rate and regular rhythm.     Pulses: Normal pulses.          Radial pulses are 2+ on the right side and 2+ on the left side.       Dorsalis pedis pulses are 2+ on the right side and 2+ on the left side.     Heart sounds: Normal heart sounds, S1 normal and S2 normal. Heart sounds not distant. No murmur heard.   No friction rub. No gallop. No S3 or S4 sounds.  Pulmonary:     Effort: Pulmonary effort is normal. No tachypnea, accessory muscle usage or respiratory distress.     Breath sounds: Normal breath sounds. No stridor. No decreased breath sounds, wheezing, rhonchi  or rales.  Chest:     Chest wall: No tenderness.  Abdominal:     General: Abdomen is flat. Bowel sounds are normal. There is no distension.     Palpations: Abdomen is soft. There is no mass or pulsatile mass.     Tenderness: There is no abdominal tenderness. There is no guarding or rebound.  Musculoskeletal:     Cervical back: Normal range of motion and neck supple. Tenderness present. No bony tenderness. Pain with movement present. Normal range of motion.     Right lower leg: No edema.     Left lower leg: No edema.     Comments: No cervical midline tenderness.  No step-offs.  There is tenderness to palpation that is reproducible along the left paraspinal and left neck muscles that extends into the left shoulder.  Skin:    General: Skin is warm and dry.     Coloration: Skin is not jaundiced or pale.     Findings: No bruising, erythema, lesion or rash.  Neurological:     General: No focal deficit present.     Mental Status: She is alert and oriented to person, place, and time.     GCS: GCS eye subscore is 4. GCS verbal subscore is 5. GCS motor subscore is 6.  Psychiatric:        Mood and Affect: Mood normal.        Behavior: Behavior normal. Behavior is cooperative.    ED Results / Procedures / Treatments   Labs (all labs ordered are listed, but only  abnormal results are displayed) Labs Reviewed  CBC - Abnormal; Notable for the following components:      Result Value   WBC 2.8 (*)    All other components within normal limits  BASIC METABOLIC PANEL  HCG, QUANTITATIVE, PREGNANCY  TROPONIN I (HIGH SENSITIVITY)  TROPONIN I (HIGH SENSITIVITY)    EKG None  Radiology DG Chest 2 View  Result Date: 04/26/2021 CLINICAL DATA:  Left neck pain and numbness.  Chest pain. EXAM: CHEST - 2 VIEW COMPARISON:  Chest radiograph 05/06/2014. FINDINGS: Monitoring leads overlie the patient. Stable cardiac and mediastinal contours. No consolidative pulmonary opacities. No pleural effusion or pneumothorax. Osseous structures unremarkable. IMPRESSION: No acute cardiopulmonary process. Electronically Signed   By: Lovey Newcomer M.D.   On: 04/26/2021 11:36    Procedures Procedures   Medications Ordered in ED Medications - No data to display  ED Course  I have reviewed the triage vital signs and the nursing notes.  Pertinent labs & imaging results that were available during my care of the patient were reviewed by me and considered in my medical decision making (see chart for details).    MDM Rules/Calculators/A&P HEAR Score: 1                       This is a well-appearing 50 year old female presents to the ED with complaints of neck pain with associated chest pain.  She is afebrile.  She is hemodynamically stable.  Exam notable for reproducible left-sided paraspinal and neck tenderness of the cervical spine that extends into the left shoulder.  Cranial nerves are all intact.  Patient has 5 out of 5 strength in all extremities.  Sensation is intact in all extremities.  EKG with normal sinus rhythm. Troponins have been normal.  Do not suspect ACS. Has no risk factors for PE. PERC negative. Extremely low suspicion for dissection. Chest x-ray normal.  Given that pain  was reproducible to palpation of the neck paraspinal muscles, I suspect that patient  presentation is due to musculoskeletal cause. Discussed these with the patient with the following plan.  Will prescribe muscle relaxers, use anti-inflammatories for pain.  Follow-up with PCP within 1 week.  Final Clinical Impression(s) / ED Diagnoses Final diagnoses:  Neck pain    Rx / DC Orders ED Discharge Orders          Ordered    methocarbamol (ROBAXIN) 500 MG tablet  2 times daily        04/26/21 1310             Samoset, Adora Fridge, PA-C 04/26/21 1336    Charlesetta Shanks, MD 05/14/21 1620

## 2021-04-26 NOTE — Discharge Instructions (Addendum)
Your examination today is most concerning for a muscular injury 1. Medications: alternate ibuprofen and tylenol for pain control, take all usual home medications as they are prescribed 2. Treatment: rest, ice, elevate and use an ACE wrap or other compressive therapy to decrease swelling. Also drink plenty of fluids and do plenty of gentle stretching and move the affected muscle through its normal range of motion to prevent stiffness. 3. Follow Up: If your symptoms do not improve please follow up with orthopedics/sports medicine or your PCP for discussion of your diagnoses and further evaluation after today's visit; if you do not have a primary care doctor use the resource guide provided to find one; Please return to the ER for worsening symptoms or other concerns.

## 2021-09-14 ENCOUNTER — Emergency Department (HOSPITAL_COMMUNITY): Payer: BC Managed Care – PPO

## 2021-09-14 ENCOUNTER — Emergency Department (HOSPITAL_COMMUNITY)
Admission: EM | Admit: 2021-09-14 | Discharge: 2021-09-15 | Disposition: A | Discharge status: Home / Self Care | Payer: BC Managed Care – PPO | Attending: Emergency Medicine | Admitting: Emergency Medicine

## 2021-09-14 ENCOUNTER — Other Ambulatory Visit: Payer: Self-pay

## 2021-09-14 ENCOUNTER — Encounter (HOSPITAL_COMMUNITY): Payer: Self-pay

## 2021-09-14 DIAGNOSIS — M5412 Radiculopathy, cervical region: Secondary | ICD-10-CM

## 2021-09-14 DIAGNOSIS — N9489 Other specified conditions associated with female genital organs and menstrual cycle: Secondary | ICD-10-CM | POA: Insufficient documentation

## 2021-09-14 DIAGNOSIS — R200 Anesthesia of skin: Secondary | ICD-10-CM | POA: Diagnosis not present

## 2021-09-14 DIAGNOSIS — Z7982 Long term (current) use of aspirin: Secondary | ICD-10-CM | POA: Diagnosis not present

## 2021-09-14 DIAGNOSIS — M542 Cervicalgia: Secondary | ICD-10-CM | POA: Diagnosis present

## 2021-09-14 LAB — CBC
HCT: 43.6 % (ref 36.0–46.0)
Hemoglobin: 13.4 g/dL (ref 12.0–15.0)
MCH: 27.9 pg (ref 26.0–34.0)
MCHC: 30.7 g/dL (ref 30.0–36.0)
MCV: 90.8 fL (ref 80.0–100.0)
Platelets: 312 10*3/uL (ref 150–400)
RBC: 4.8 MIL/uL (ref 3.87–5.11)
RDW: 13.6 % (ref 11.5–15.5)
WBC: 5.8 10*3/uL (ref 4.0–10.5)
nRBC: 0 % (ref 0.0–0.2)

## 2021-09-14 LAB — I-STAT BETA HCG BLOOD, ED (MC, WL, AP ONLY): I-stat hCG, quantitative: <5 m[IU]/mL (ref <5)

## 2021-09-14 NOTE — ED Notes (Signed)
Light blue sent down to lab

## 2021-09-14 NOTE — ED Provider Notes (Signed)
[facility] DEPT Provider Note   CSN: [id number] Arrival date & time: 09/14/21  2157     History  Chief Complaint  Patient presents with   Chest Pain   left sided numbness    Christina Schmidt is a 51 y.o. female.  Patient presents to the emergency department with multiple complaints.  Patient has been experiencing left-sided neck pain for 3 days.  Patient reports that the pain has been constant, is radiating to the left arm.  She denies any injury.  She has not had any weakness of the extremities but has noticed numbness of fingers and toes at night.  Numbness is not currently present.  No associated shortness of breath.  She did have some chest discomfort earlier but that was relieved with Tums.      Home Medications Prior to Admission medications   Medication Sig Start Date End Date Taking? Authorizing Provider  albuterol (PROVENTIL HFA;VENTOLIN HFA) 108 (90 BASE) MCG/ACT inhaler Inhale 1-2 puffs into the lungs every 6 (six) hours as needed for wheezing or shortness of breath.    Yes [provider]  aspirin EC 81 MG tablet Take 162 mg by mouth daily as needed for moderate pain. Swallow whole.   Yes [provider]  calcium carbonate (TUMS - DOSED IN MG ELEMENTAL CALCIUM) 500 MG chewable tablet Chew 2 tablets by mouth 2 (two) times daily as needed for indigestion or heartburn.   Yes [provider]  Cholecalciferol (VITAMIN D3) 1.25 MG (50000 UT) CAPS Take 50,000 capsules by mouth once a week. Wednesday 08/19/21  Yes [provider]  ibuprofen (ADVIL) 800 MG tablet Take 800 mg by mouth every 8 (eight) hours as needed for mild pain.   Yes [provider]  methocarbamol (ROBAXIN) 500 MG tablet Take 1 tablet (500 mg total) by mouth every 8 (eight) hours as needed for muscle spasms. 09/15/21  Yes Imberly Troxler, Gwenyth Allegra, MD  methylPREDNISolone (MEDROL DOSEPAK) 4 MG TBPK tablet As directed 09/15/21  Yes Zita Ozimek,  Gwenyth Allegra, MD  pseudoephedrine (SUDAFED) 120 MG 12 hr tablet Take 120 mg by mouth daily as needed for congestion.   Yes [provider]  EPINEPHrine 0.3 mg/0.3 mL IJ SOAJ injection Inject 0.3 mg into the muscle as needed for anaphylaxis. Patient not taking: Reported on 09/15/2021 12/25/20   [provider]  tretinoin (RETIN-A) 0.05 % cream Apply 1 application topically at bedtime as needed (acne). Patient not taking: Reported on 09/15/2021 02/04/21   [provider]      Allergies    Shellfish-derived products    Review of Systems   Review of Systems  Cardiovascular:  Positive for chest pain.  Musculoskeletal:  Positive for neck pain.  Neurological:  Positive for numbness.   Physical Exam Updated Vital Signs BP (!) 140/102    Pulse (!) 57    Temp 98.2 F (36.8 C) (Oral)    Resp 15    Ht 5' 6"  (1.676 m)    Wt 74.8 kg    LMP 08/29/2021 (Approximate)    SpO2 100%    BMI 26.63 kg/m  Physical Exam Vitals and nursing note reviewed.  Constitutional:      General: She is not in acute distress.    Appearance: She is well-developed.  HENT:     Head: Normocephalic and atraumatic.     Mouth/Throat:     Mouth: Mucous membranes are moist.  Eyes:     General: Vision grossly intact. Gaze  aligned appropriately.     Extraocular Movements: Extraocular movements intact.     Conjunctiva/sclera: Conjunctivae normal.  Neck:   Cardiovascular:     Rate and Rhythm: Normal rate and regular rhythm.     Pulses: Normal pulses.          Radial pulses are 2+ on the right side and 2+ on the left side.     Heart sounds: Normal heart sounds, S1 normal and S2 normal. No murmur heard.   No friction rub. No gallop.  Pulmonary:     Effort: Pulmonary effort is normal. No respiratory distress.     Breath sounds: Normal breath sounds.  Abdominal:     General: Bowel sounds are normal.     Palpations: Abdomen is soft.     Tenderness: There is no abdominal tenderness. There is no  guarding or rebound.     Hernia: No hernia is present.  Musculoskeletal:        General: No swelling.     Cervical back: Pain with movement and muscular tenderness present. No spinous process tenderness. Decreased range of motion.     Right lower leg: No edema.     Left lower leg: No edema.  Skin:    General: Skin is warm and dry.     Capillary Refill: Capillary refill takes less than 2 seconds.     Findings: No ecchymosis, erythema, rash or wound.  Neurological:     General: No focal deficit present.     Mental Status: She is alert and oriented to person, place, and time.     GCS: GCS eye subscore is 4. GCS verbal subscore is 5. GCS motor subscore is 6.     Cranial Nerves: Cranial nerves 2-12 are intact.     Sensory: Sensation is intact.     Motor: Motor function is intact.     Coordination: Coordination is intact.     Comments: Bilateral upper extremity strength 5 out of 5  Psychiatric:        Attention and Perception: Attention normal.        Mood and Affect: Mood normal.        Speech: Speech normal.        Behavior: Behavior normal.    ED Results / Procedures / Treatments   Labs (all labs ordered are listed, but only abnormal results are displayed) Labs Reviewed  BASIC METABOLIC PANEL  CBC  I-STAT BETA HCG BLOOD, ED ([facility], [facility], [facility] ONLY)  TROPONIN I (HIGH SENSITIVITY)  TROPONIN I (HIGH SENSITIVITY)    EKG EKG Interpretation  Date/Time:  Sunday September 14 2021 22:58:20 EST Ventricular Rate:  61 PR Interval:  154 QRS Duration: 88 QT Interval:  432 QTC Calculation: 436 R Axis:   82 Text Interpretation: Sinus rhythm Consider left atrial enlargement ST elev, probable normal early repol pattern Baseline wander in lead(s) II III aVF V6 No significant change since last tracing Confirmed by Orpah Greek [phone]) on 09/15/2021 2:08:36 AM  Radiology DG Chest 2 View  Result Date: 09/14/2021 CLINICAL DATA:  Chest pain. EXAM: CHEST - 2 VIEW COMPARISON:  04/26/2021  FINDINGS: The cardiomediastinal contours are normal. The lungs are clear. Pulmonary vasculature is normal. No consolidation, pleural effusion, or pneumothorax. No acute osseous abnormalities are seen. IMPRESSION: Negative radiographs of the chest. Electronically Signed   By: Keith Rake M.D.   On: 09/14/2021 22:45   CT HEAD WO CONTRAST (5MM)  Result Date: 09/15/2021 CLINICAL DATA:  Neuro deficit, acute,  stroke suspected. Cervical radiculopathy. Numbness and tingling on left side. EXAM: CT HEAD WITHOUT CONTRAST CT CERVICAL SPINE WITHOUT CONTRAST TECHNIQUE: Multidetector CT imaging of the head and cervical spine was performed following the standard protocol without intravenous contrast. Multiplanar CT image reconstructions of the cervical spine were also generated. RADIATION DOSE REDUCTION: This exam was performed according to the departmental dose-optimization program which includes automated exposure control, adjustment of the mA and/or kV according to patient size and/or use of iterative reconstruction technique. COMPARISON:  None. FINDINGS: CT HEAD FINDINGS Brain: No evidence of acute infarction, hemorrhage, hydrocephalus, extra-axial collection or mass lesion/mass effect. Vascular: No hyperdense vessel or unexpected calcification. Skull: Normal. Negative for fracture or focal lesion. Sinuses/Orbits: No acute finding. Other: None. CT CERVICAL SPINE FINDINGS Alignment: Normal. Skull base and vertebrae: No acute fracture. No primary bone lesion or focal pathologic process. Soft tissues and spinal canal: No prevertebral fluid or swelling. No visible canal hematoma. Disc levels:  Intervertebral disc space is maintained. Upper chest: Negative. Other: None. IMPRESSION: 1. No acute intracranial process. 2. No evidence of cervical spine fracture. Electronically Signed   By: Brett Fairy M.D.   On: 09/15/2021 00:11   CT CERVICAL SPINE WO CONTRAST  Result Date: 09/15/2021 CLINICAL DATA:  Neuro deficit, acute,  stroke suspected. Cervical radiculopathy. Numbness and tingling on left side. EXAM: CT HEAD WITHOUT CONTRAST CT CERVICAL SPINE WITHOUT CONTRAST TECHNIQUE: Multidetector CT imaging of the head and cervical spine was performed following the standard protocol without intravenous contrast. Multiplanar CT image reconstructions of the cervical spine were also generated. RADIATION DOSE REDUCTION: This exam was performed according to the departmental dose-optimization program which includes automated exposure control, adjustment of the mA and/or kV according to patient size and/or use of iterative reconstruction technique. COMPARISON:  None. FINDINGS: CT HEAD FINDINGS Brain: No evidence of acute infarction, hemorrhage, hydrocephalus, extra-axial collection or mass lesion/mass effect. Vascular: No hyperdense vessel or unexpected calcification. Skull: Normal. Negative for fracture or focal lesion. Sinuses/Orbits: No acute finding. Other: None. CT CERVICAL SPINE FINDINGS Alignment: Normal. Skull base and vertebrae: No acute fracture. No primary bone lesion or focal pathologic process. Soft tissues and spinal canal: No prevertebral fluid or swelling. No visible canal hematoma. Disc levels:  Intervertebral disc space is maintained. Upper chest: Negative. Other: None. IMPRESSION: 1. No acute intracranial process. 2. No evidence of cervical spine fracture. Electronically Signed   By: Brett Fairy M.D.   On: 09/15/2021 00:11    Procedures Procedures    Medications Ordered in ED Medications  ketorolac (TORADOL) 30 MG/ML injection 15 mg (has no administration in time range)  dexamethasone (DECADRON) injection 10 mg (has no administration in time range)  oxyCODONE-acetaminophen (PERCOCET/ROXICET) 5-325 MG per tablet 1 tablet (has no administration in time range)    ED Course/ Medical Decision Making/ A&P                           Medical Decision Making Amount and/or Complexity of Data Reviewed Labs:  ordered. Radiology: ordered.   Patient presents to the emergency department with multiple complaints.  Patient reports that she has been having pain on the left side of her neck for 3 days.  This is exacerbated by movement of the neck.  She denies any injury.  She states that she thought she slept on it wrong but it seems to be getting worse, not better.  Patient also complaining of chest pain that was relieved by Tums earlier.  Chest pain is not currently present.  No associated shortness of breath.  Patient reports that at night she has noticed tingling in her left hand and fingers as well as foot.  This is not currently present.  Differential diagnosis acute coronary syndrome, angina, noncardiac chest pain, cervical radiculopathy, CVA  EKG performed at arrival unchanged from prior, no evidence of ischemia or acute infarct.  Troponins are negative x2.  Chest pain was brief and very atypical for cardiac etiology.  Neck and arm pain is more consistent with cervical radiculopathy.  It is reproducible by palpation of the neck and movements of the neck.  CT cervical spine does not show any acute pathology.  Patient's intermittent numbness in her left leg, primarily distally, not explained by cervical radiculopathy.  CT head performed.  No evidence of stroke or other acute pathology.  I feel that this is low likelihood for TIA and stroke, does not require further inpatient work-up.  Patient will be treated empirically for cervical radiculopathy, follow-up with primary care.  Given return precautions.        Final Clinical Impression(s) / ED Diagnoses Final diagnoses:  Cervical radiculopathy    Rx / DC Orders ED Discharge Orders          Ordered    methylPREDNISolone (MEDROL DOSEPAK) 4 MG TBPK tablet        09/15/21 0211    methocarbamol (ROBAXIN) 500 MG tablet  Every 8 hours PRN        09/15/21 0211              Orpah Greek, MD 09/15/21 [phone]

## 2021-09-14 NOTE — ED Triage Notes (Signed)
Pt reports with neck pain that goes down her left side causing numbness and tingling. Pt states that the pain shoots into her chest too. Pt states that this just started earlier today. Pt reports taking (2) 81 mg aspirins before arrival.

## 2021-09-15 LAB — BASIC METABOLIC PANEL
Anion gap: 6 (ref 5–15)
BUN: 9 mg/dL (ref 6–20)
CO2: 28 mmol/L (ref 22–32)
Calcium: 9.7 mg/dL (ref 8.9–10.3)
Chloride: 104 mmol/L (ref 98–111)
Creatinine, Ser: 0.7 mg/dL (ref 0.44–1.00)
GFR, Estimated: >60 mL/min (ref >60)
Glucose, Bld: 93 mg/dL (ref 70–99)
Potassium: 3.7 mmol/L (ref 3.5–5.1)
Sodium: 138 mmol/L (ref 135–145)

## 2021-09-15 LAB — TROPONIN I (HIGH SENSITIVITY)
Troponin I (High Sensitivity): 4 ng/L (ref <18)
Troponin I (High Sensitivity): 3 ng/L (ref <18)

## 2021-09-15 MED ORDER — METHYLPREDNISOLONE 4 MG PO TBPK: ORAL_TABLET | ORAL | 0 refills | Status: AC

## 2021-09-15 MED ORDER — KETOROLAC TROMETHAMINE 30 MG/ML IJ SOLN
15.0000 mg | Freq: Once | INTRAMUSCULAR | Status: AC
  Administered 2021-09-15: 15 mg via INTRAVENOUS
  Filled 2021-09-15: qty 1

## 2021-09-15 MED ORDER — METHOCARBAMOL 500 MG PO TABS: 500.0000 mg | ORAL_TABLET | Freq: Three times a day (TID) | ORAL | 0 refills | Status: AC | PRN

## 2021-09-15 MED ORDER — DEXAMETHASONE SODIUM PHOSPHATE 10 MG/ML IJ SOLN
10.0000 mg | Freq: Once | INTRAMUSCULAR | Status: AC
  Administered 2021-09-15: 10 mg via INTRAVENOUS
  Filled 2021-09-15: qty 1

## 2021-09-15 MED ORDER — OXYCODONE-ACETAMINOPHEN 5-325 MG PO TABS
1.0000 | ORAL_TABLET | Freq: Once | ORAL | Status: AC
  Administered 2021-09-15: 1 via ORAL
  Filled 2021-09-15: qty 1

## 2021-09-15 NOTE — ED Notes (Signed)
Patient ambulatory to restroom with steady gait and no assistance.

## 2022-01-28 ENCOUNTER — Other Ambulatory Visit: Payer: Self-pay | Admitting: Family Medicine

## 2022-01-28 DIAGNOSIS — Z1231 Encounter for screening mammogram for malignant neoplasm of breast: Secondary | ICD-10-CM

## 2022-03-09 ENCOUNTER — Ambulatory Visit
Admission: RE | Admit: 2022-03-09 | Discharge: 2022-03-09 | Disposition: A | Discharge status: Home / Self Care | Payer: BC Managed Care – PPO | Source: Ambulatory Visit | Attending: Family Medicine | Admitting: Family Medicine

## 2022-03-09 DIAGNOSIS — Z1231 Encounter for screening mammogram for malignant neoplasm of breast: Secondary | ICD-10-CM

## 2023-01-29 ENCOUNTER — Other Ambulatory Visit: Payer: Self-pay | Admitting: Family Medicine

## 2023-01-29 DIAGNOSIS — Z1231 Encounter for screening mammogram for malignant neoplasm of breast: Secondary | ICD-10-CM

## 2023-03-11 ENCOUNTER — Ambulatory Visit
Admission: RE | Admit: 2023-03-11 | Discharge: 2023-03-11 | Disposition: A | Discharge status: Home / Self Care | Payer: BC Managed Care – PPO | Source: Ambulatory Visit | Attending: Family Medicine | Admitting: Family Medicine

## 2023-03-11 DIAGNOSIS — Z1231 Encounter for screening mammogram for malignant neoplasm of breast: Secondary | ICD-10-CM

## 2023-05-31 IMAGING — CR DG CHEST 2V
2 series · 2 of 2 positions shown · non-contrast
Comparison: Chest radiograph 05/06/2014.

CLINICAL DATA: Left neck pain and numbness.  Chest pain.

EXAM:
CHEST - 2 VIEW

[w chest pa]
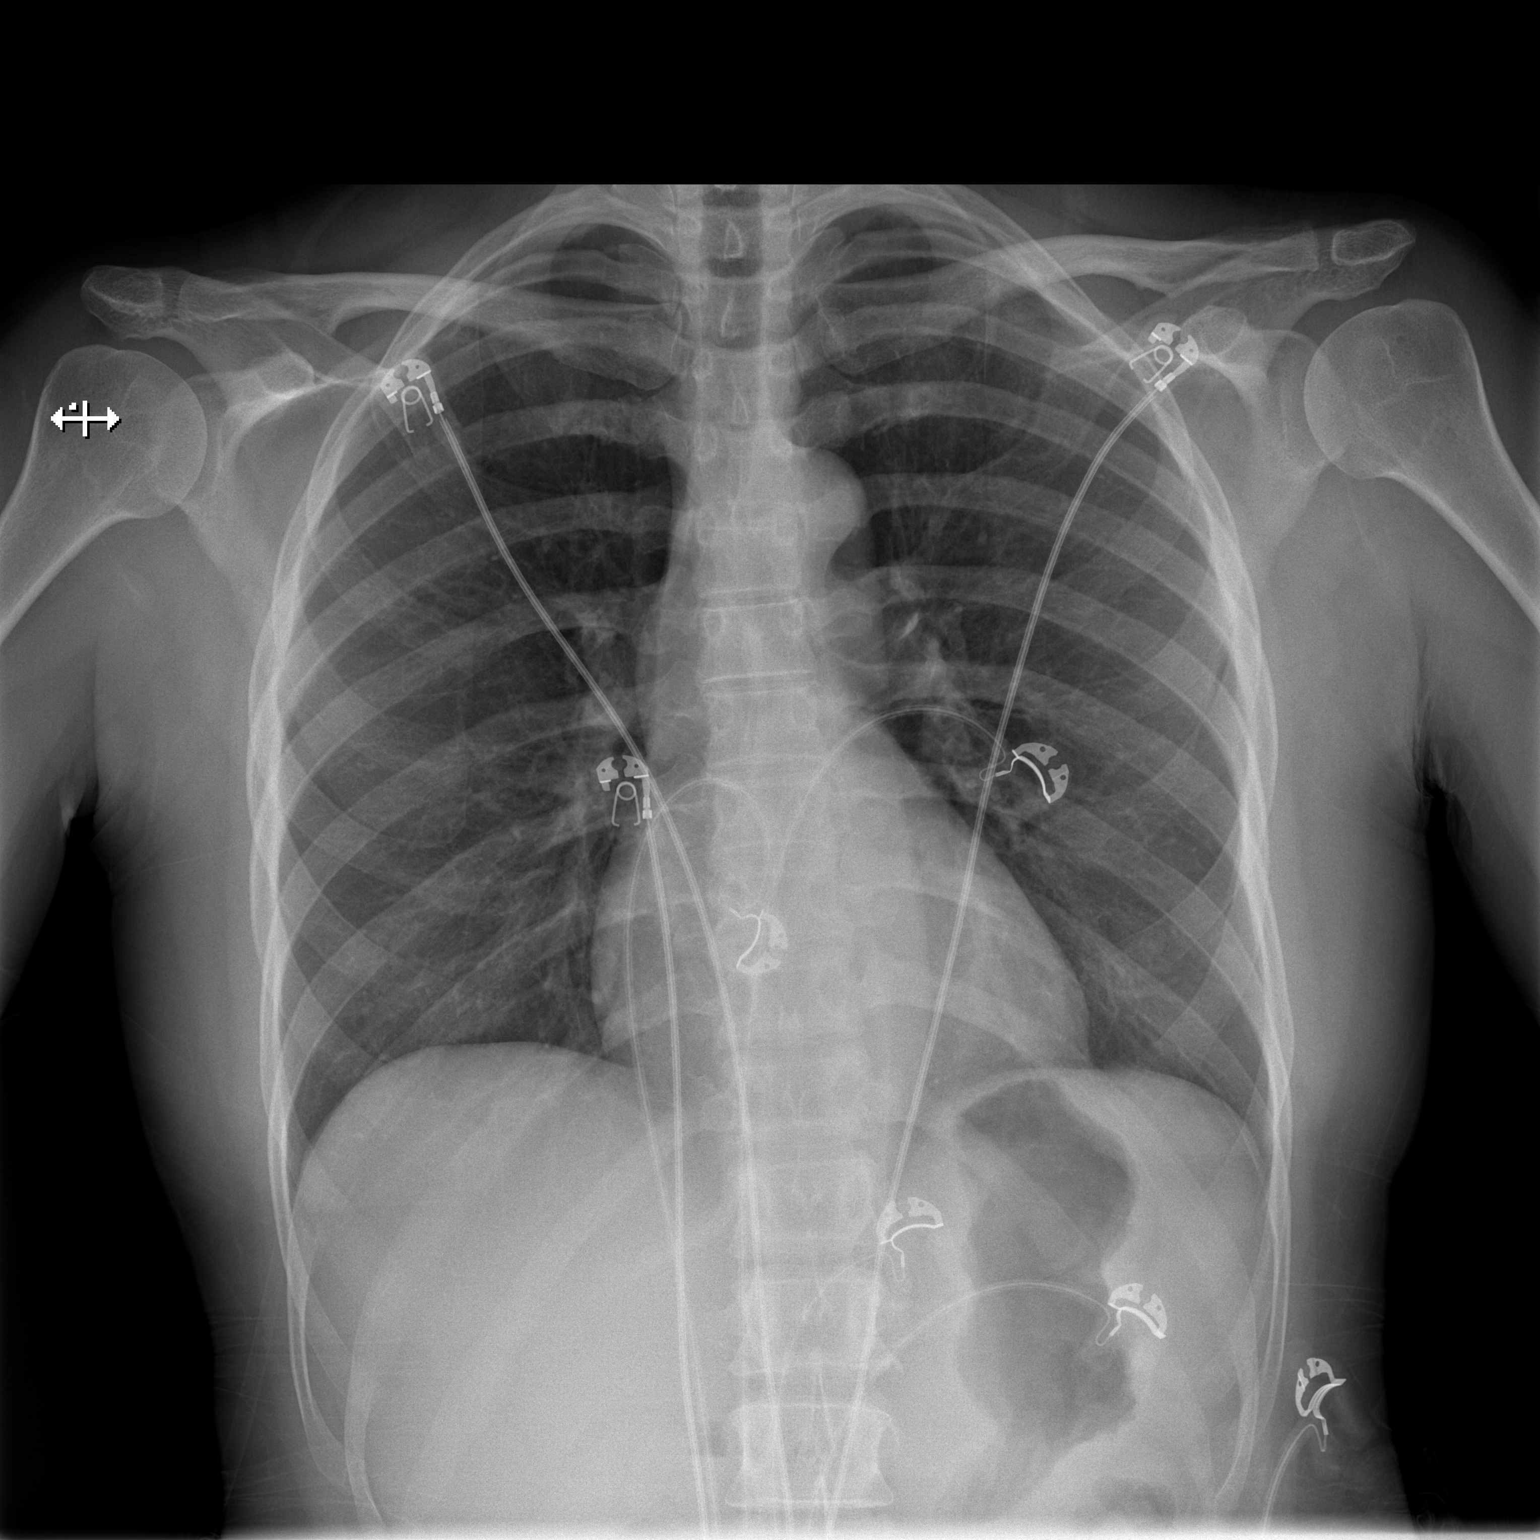

[w chest lat]
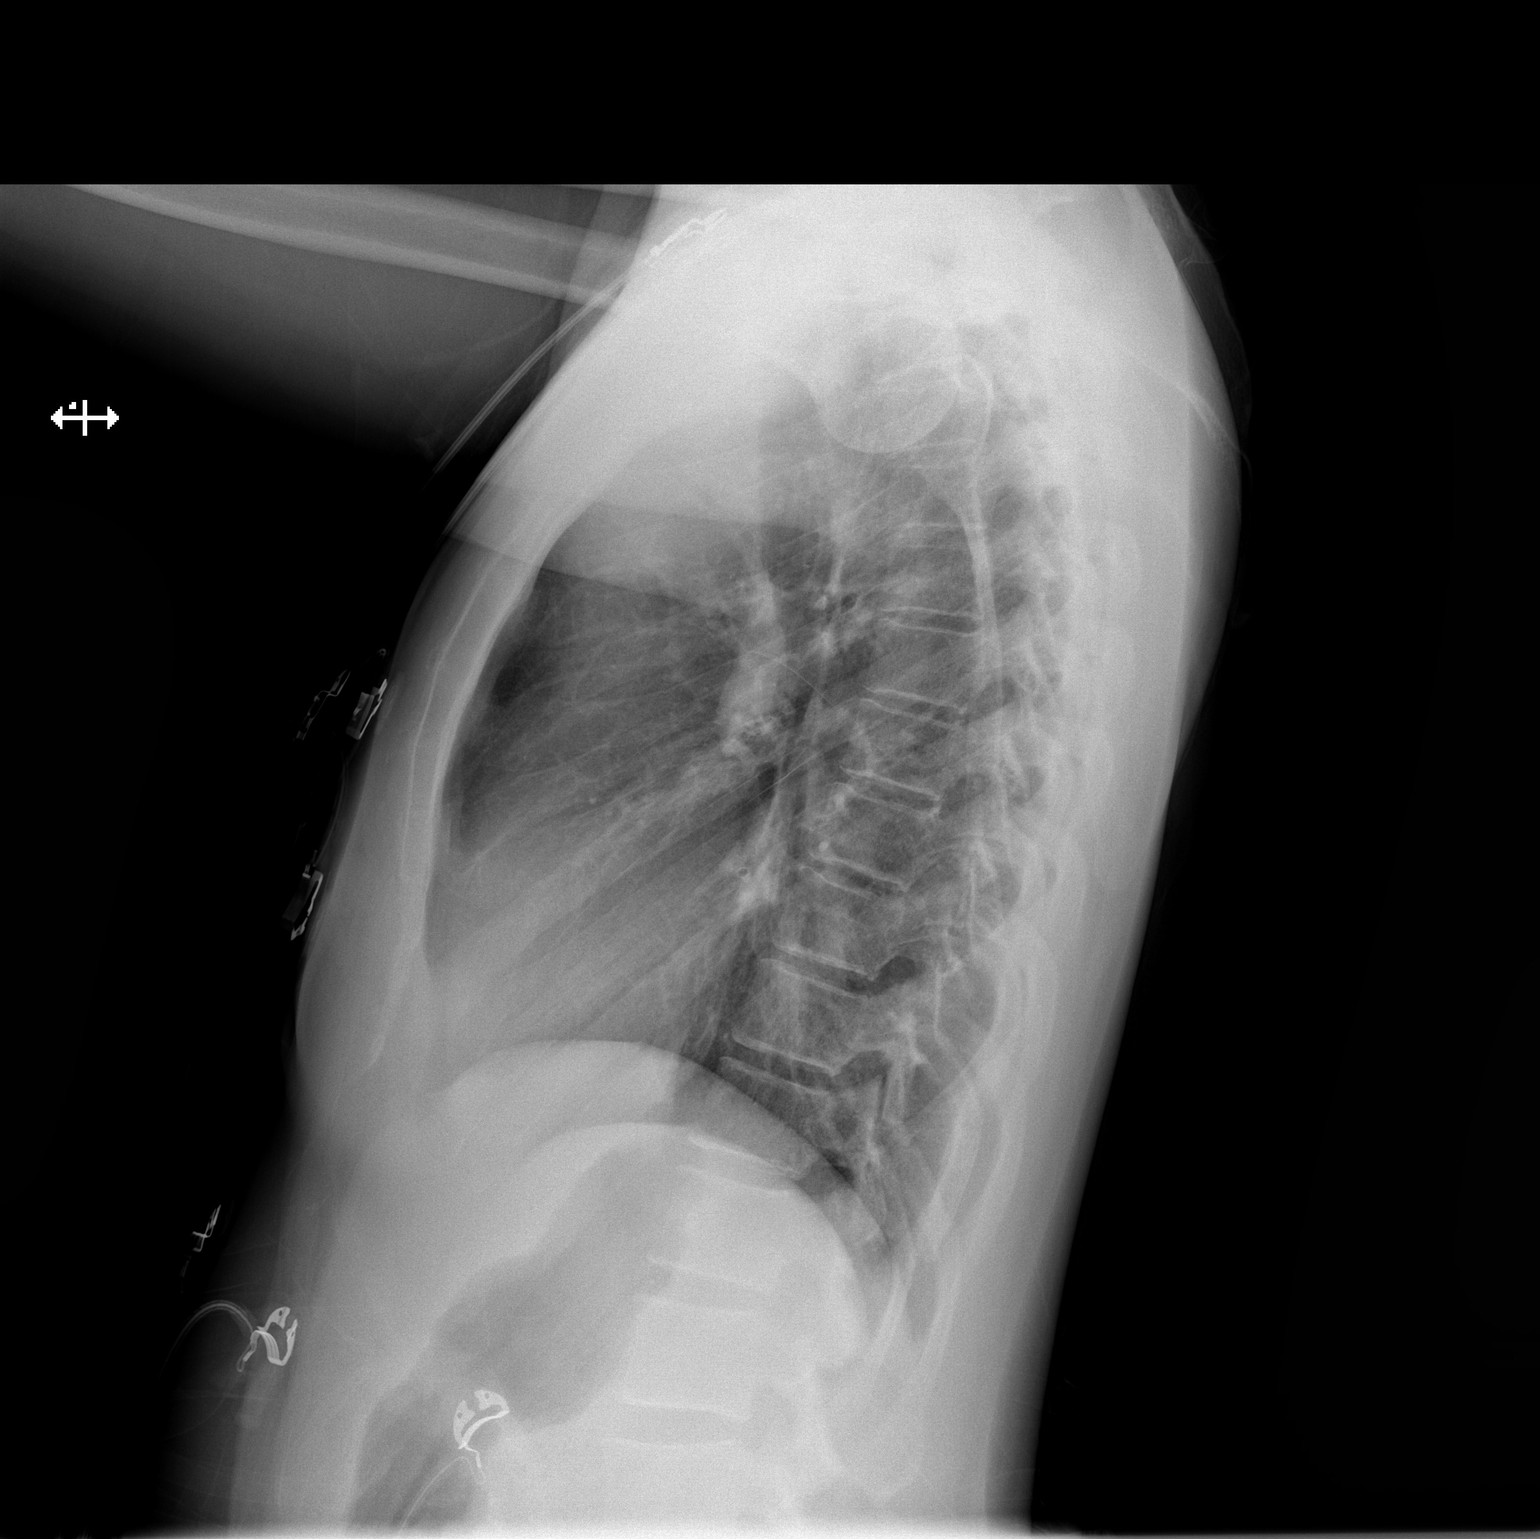

[2 of 2 positions shown; findings below may reference images not displayed]

FINDINGS: Monitoring leads overlie the patient. Stable cardiac and mediastinal
contours. No consolidative pulmonary opacities. No pleural effusion
or pneumothorax. Osseous structures unremarkable.
IMPRESSION: No acute cardiopulmonary process.

## 2023-07-02 ENCOUNTER — Other Ambulatory Visit: Payer: Self-pay

## 2023-07-02 ENCOUNTER — Emergency Department (HOSPITAL_COMMUNITY)
Admission: EM | Admit: 2023-07-02 | Discharge: 2023-07-02 | Disposition: A | Discharge status: Home / Self Care | Payer: BC Managed Care – PPO | Attending: Student | Admitting: Student

## 2023-07-02 DIAGNOSIS — Z1152 Encounter for screening for COVID-19: Secondary | ICD-10-CM | POA: Insufficient documentation

## 2023-07-02 DIAGNOSIS — R5383 Other fatigue: Secondary | ICD-10-CM | POA: Insufficient documentation

## 2023-07-02 DIAGNOSIS — Z7982 Long term (current) use of aspirin: Secondary | ICD-10-CM | POA: Diagnosis not present

## 2023-07-02 DIAGNOSIS — J45909 Unspecified asthma, uncomplicated: Secondary | ICD-10-CM | POA: Insufficient documentation

## 2023-07-02 DIAGNOSIS — M5412 Radiculopathy, cervical region: Secondary | ICD-10-CM | POA: Insufficient documentation

## 2023-07-02 DIAGNOSIS — I10 Essential (primary) hypertension: Secondary | ICD-10-CM | POA: Diagnosis not present

## 2023-07-02 DIAGNOSIS — M79602 Pain in left arm: Secondary | ICD-10-CM | POA: Diagnosis present

## 2023-07-02 LAB — COMPREHENSIVE METABOLIC PANEL
ALT: 16 U/L (ref 0–44)
AST: 21 U/L (ref 15–41)
Albumin: 4.3 g/dL (ref 3.5–5.0)
Alkaline Phosphatase: 44 U/L (ref 38–126)
Anion gap: 8 (ref 5–15)
BUN: 10 mg/dL (ref 6–20)
CO2: 23 mmol/L (ref 22–32)
Calcium: 9.4 mg/dL (ref 8.9–10.3)
Chloride: 106 mmol/L (ref 98–111)
Creatinine, Ser: 0.61 mg/dL (ref 0.44–1.00)
GFR, Estimated: >60 mL/min (ref >60)
Glucose, Bld: 88 mg/dL (ref 70–99)
Potassium: 3.8 mmol/L (ref 3.5–5.1)
Sodium: 137 mmol/L (ref 135–145)
Total Bilirubin: 0.8 mg/dL (ref <1.2)
Total Protein: 7.1 g/dL (ref 6.5–8.1)

## 2023-07-02 LAB — URINALYSIS, ROUTINE W REFLEX MICROSCOPIC
Bilirubin Urine: NEGATIVE
Glucose, UA: NEGATIVE mg/dL
Hgb urine dipstick: NEGATIVE
Ketones, ur: NEGATIVE mg/dL
Leukocytes,Ua: NEGATIVE
Nitrite: NEGATIVE
Protein, ur: NEGATIVE mg/dL
Specific Gravity, Urine: 1.004 — ABNORMAL LOW (ref 1.005–1.030)
pH: 6 (ref 5.0–8.0)

## 2023-07-02 LAB — CBC WITH DIFFERENTIAL/PLATELET
Abs Immature Granulocytes: 0.01 10*3/uL (ref 0.00–0.07)
Basophils Absolute: 0.1 10*3/uL (ref 0.0–0.1)
Basophils Relative: 1 %
Eosinophils Absolute: 0.1 10*3/uL (ref 0.0–0.5)
Eosinophils Relative: 2 %
HCT: 39.7 % (ref 36.0–46.0)
Hemoglobin: 13 g/dL (ref 12.0–15.0)
Immature Granulocytes: 0 %
Lymphocytes Relative: 37 %
Lymphs Abs: 1.9 10*3/uL (ref 0.7–4.0)
MCH: 29.5 pg (ref 26.0–34.0)
MCHC: 32.7 g/dL (ref 30.0–36.0)
MCV: 90 fL (ref 80.0–100.0)
Monocytes Absolute: 0.5 10*3/uL (ref 0.1–1.0)
Monocytes Relative: 10 %
Neutro Abs: 2.6 10*3/uL (ref 1.7–7.7)
Neutrophils Relative %: 50 %
Platelets: 277 10*3/uL (ref 150–400)
RBC: 4.41 MIL/uL (ref 3.87–5.11)
RDW: 13.3 % (ref 11.5–15.5)
WBC: 5.2 10*3/uL (ref 4.0–10.5)
nRBC: 0 % (ref 0.0–0.2)

## 2023-07-02 LAB — RESP PANEL BY RT-PCR (RSV, FLU A&B, COVID)  RVPGX2
Influenza A by PCR: NEGATIVE
Influenza B by PCR: NEGATIVE
Resp Syncytial Virus by PCR: NEGATIVE
SARS Coronavirus 2 by RT PCR: NEGATIVE

## 2023-07-02 LAB — TROPONIN I (HIGH SENSITIVITY): Troponin I (High Sensitivity): <2 ng/L (ref <18)

## 2023-07-02 MED ORDER — LIDOCAINE 5 % EX PTCH: 1.0000 | MEDICATED_PATCH | CUTANEOUS | 0 refills | Status: AC

## 2023-07-02 MED ORDER — NAPROXEN 500 MG PO TABS
500.0000 mg | ORAL_TABLET | Freq: Once | ORAL | Status: AC
  Administered 2023-07-02: 500 mg via ORAL
  Filled 2023-07-02: qty 1

## 2023-07-02 MED ORDER — LIDOCAINE 5 % EX PTCH
1.0000 | MEDICATED_PATCH | CUTANEOUS | Status: DC
  Administered 2023-07-02: 1 via TRANSDERMAL
  Filled 2023-07-02: qty 1

## 2023-07-02 MED ORDER — NAPROXEN 375 MG PO TABS: 375.0000 mg | ORAL_TABLET | Freq: Two times a day (BID) | ORAL | 0 refills | Status: AC

## 2023-07-02 NOTE — ED Triage Notes (Signed)
Pt BIB EMS coming from work c/o of hypertension and left arm pain.  States feeling light headed x 1 day. Pain 3/10 on left arm that shoots to her hand. Denies CP.   BP 156/76, HR 70s, Spo2 97% RA, CBG 92,

## 2023-07-02 NOTE — ED Provider Notes (Signed)
[place] EMERGENCY DEPARTMENT AT [facility] Provider Note  CSN: [id number] Arrival date & time: 07/02/23 1115  Chief Complaint(s) No chief complaint on file.  HPI Christina Schmidt is a 52 y.o. female with PMH asthma currently taking phentermine for weight loss who presents emergency department for evaluation of multiple complaints including left arm pain, hypertension and generalized fatigue.  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  Has had a constant pain in the left shoulder that shoots down the left arm but no associated chest pain, shortness of breath, abdominal pain, nausea, vomiting or other systemic symptoms.   Past Medical History Past Medical History:  Diagnosis Date   Asthma    There are no problems to display for this patient.  Home Medication(s) Prior to Admission medications   Medication Sig Start Date End Date Taking? Authorizing Provider  lidocaine (LIDODERM) 5 % Place 1 patch onto the skin daily. Remove & Discard patch within 12 hours or as directed by MD 07/02/23  Yes Enzo Treu, MD  naproxen (NAPROSYN) 375 MG tablet Take 1 tablet (375 mg total) by mouth 2 (two) times daily. 07/02/23  Yes Ginnifer Creelman, MD  albuterol (PROVENTIL HFA;VENTOLIN HFA) 108 (90 BASE) MCG/ACT inhaler Inhale 1-2 puffs into the lungs every 6 (six) hours as needed for wheezing or shortness of breath.     [provider]  aspirin EC 81 MG tablet Take 162 mg by mouth daily as needed for moderate pain. Swallow whole.    [provider]  calcium carbonate (TUMS - DOSED IN MG ELEMENTAL CALCIUM) 500 MG chewable tablet Chew 2 tablets by mouth 2 (two) times daily as needed for indigestion or heartburn.    [provider]  Cholecalciferol (VITAMIN D3) 1.25 MG (50000 UT) CAPS Take 50,000 capsules by mouth once a week. Wednesday 08/19/21   [provider]  EPINEPHrine 0.3 mg/0.3 mL IJ SOAJ injection  Inject 0.3 mg into the muscle as needed for anaphylaxis. Patient not taking: Reported on 09/15/2021 12/25/20   [provider]  ibuprofen (ADVIL) 800 MG tablet Take 800 mg by mouth every 8 (eight) hours as needed for mild pain.    [provider]  methocarbamol (ROBAXIN) 500 MG tablet Take 1 tablet (500 mg total) by mouth every 8 (eight) hours as needed for muscle spasms. 09/15/21   Gilda Crease, MD  methylPREDNISolone (MEDROL DOSEPAK) 4 MG TBPK tablet As directed 09/15/21   Gilda Crease, MD  pseudoephedrine (SUDAFED) 120 MG 12 hr tablet Take 120 mg by mouth daily as needed for congestion.    [provider]  tretinoin (RETIN-A) 0.05 % cream Apply 1 application topically at bedtime as needed (acne). Patient not taking: Reported on 09/15/2021 02/04/21   [provider]  Past Surgical History Past Surgical History:  Procedure Laterality Date   CESAREAN SECTION     Family History Family History  Problem Relation Age of Onset   Breast cancer Mother 78    Social History Social History   Tobacco Use   Smoking status: Never   Smokeless tobacco: Never  Substance Use Topics   Alcohol use: No   Drug use: No   Allergies Shellfish-derived products  Review of Systems Review of Systems  Constitutional:  Positive for fatigue.  Musculoskeletal:  Positive for arthralgias.    Physical Exam Vital Signs  I have reviewed the triage vital signs BP (!) 141/102 (BP Location: Left Arm)   Pulse 66   Temp 98 F (36.7 C) (Oral)   Resp 15   Ht 5\' 5"  (1.651 m)   Wt 74.8 kg   SpO2 100%   BMI 27.46 kg/m   Physical Exam Vitals and nursing note reviewed.  Constitutional:      General: She is not in acute distress.    Appearance: She is well-developed.  HENT:     Head: Normocephalic and atraumatic.  Eyes:      Conjunctiva/sclera: Conjunctivae normal.  Cardiovascular:     Rate and Rhythm: Normal rate and regular rhythm.     Heart sounds: No murmur heard. Pulmonary:     Effort: Pulmonary effort is normal. No respiratory distress.     Breath sounds: Normal breath sounds.  Abdominal:     Palpations: Abdomen is soft.     Tenderness: There is no abdominal tenderness.  Musculoskeletal:        General: No swelling.     Cervical back: Neck supple.  Skin:    General: Skin is warm and dry.     Capillary Refill: Capillary refill takes less than 2 seconds.  Neurological:     Mental Status: She is alert.  Psychiatric:        Mood and Affect: Mood normal.     ED Results and Treatments Labs (all labs ordered are listed, but only abnormal results are displayed) Labs Reviewed  URINALYSIS, ROUTINE W REFLEX MICROSCOPIC - Abnormal; Notable for the following components:      Result Value   Color, Urine STRAW (*)    APPearance HAZY (*)    Specific Gravity, Urine 1.004 (*)    All other components within normal limits  RESP PANEL BY RT-PCR (RSV, FLU A&B, COVID)  RVPGX2  COMPREHENSIVE METABOLIC PANEL  CBC WITH DIFFERENTIAL/PLATELET  TROPONIN I (HIGH SENSITIVITY)                                                                                                                          Radiology No results found.  Pertinent labs & imaging results that were available during my care of the patient were reviewed by me and considered in my medical decision making (see MDM for details).  Medications Ordered in ED Medications  lidocaine (LIDODERM) 5 % 1 patch (1  patch Transdermal Patch Applied 07/02/23 1356)  naproxen (NAPROSYN) tablet 500 mg (500 mg Oral Given 07/02/23 1356)                                                                                                                                     Procedures Procedures  (including critical care time)  Medical Decision Making / ED Course   This  patient presents to the ED for concern of arm pain, fatigue, hypertension, this involves an extensive number of treatment options, and is a complaint that carries with it a high risk of complications and morbidity.  The differential diagnosis includes electrolyte abnormality, cervical radiculopathy, atypical ACS, viral URI, dehydration  MDM: Patient seen in the emergency room for evaluation of multiple complaints as described above.  Physical exam with reproducible tenderness over the supraspinatus and lateral deltoid on the left but is otherwise unremarkable.  Laboratory evaluation is unremarkable.  Urinalysis negative.  COVID, flu, RSV negative.  High-sensitivity troponin negative.  Patient given naproxen and a Lidoderm patch over the shoulder and symptoms improved.  Low suspicion for atypical ACS given negative troponins, no chest pain, no shortness of breath.  X-ray imaging deferred given full range of motion of the shoulder and x-ray of the chest deferred given no chest pain or shortness of breath.  Patient has a known history of cervical radiculopathy and patient does state that this feels similar to when she was previous evaluated in the ER for this.  With overall reassuring workup, patient does not meet inpatient criteria for admission and will be discharged with outpatient follow-up.  Return precautions given which she voiced understanding.   Additional history obtained: -Additional history obtained from friend -External records from outside source obtained and reviewed including: Chart review including previous notes, labs, imaging, consultation notes   Lab Tests: -I ordered, reviewed, and interpreted labs.   The pertinent results include:   Labs Reviewed  URINALYSIS, ROUTINE W REFLEX MICROSCOPIC - Abnormal; Notable for the following components:      Result Value   Color, Urine STRAW (*)    APPearance HAZY (*)    Specific Gravity, Urine 1.004 (*)    All other components within normal  limits  RESP PANEL BY RT-PCR (RSV, FLU A&B, COVID)  RVPGX2  COMPREHENSIVE METABOLIC PANEL  CBC WITH DIFFERENTIAL/PLATELET  TROPONIN I (HIGH SENSITIVITY)      EKG   EKG Interpretation Date/Time:  Friday July 02 2023 11:30:06 EST Ventricular Rate:  69 PR Interval:  140 QRS Duration:  81 QT Interval:  402 QTC Calculation: 431 R Axis:   77  Text Interpretation: Sinus rhythm Confirmed by Beryle Zeitz ([id number]) on 07/02/2023 6:40:02 PM          Medicines ordered and prescription drug management: Meds ordered this encounter  Medications   lidocaine (LIDODERM) 5 % 1 patch   naproxen (NAPROSYN) tablet 500 mg   naproxen (NAPROSYN) 375  MG tablet    Sig: Take 1 tablet (375 mg total) by mouth 2 (two) times daily.    Dispense:  20 tablet    Refill:  0   lidocaine (LIDODERM) 5 %    Sig: Place 1 patch onto the skin daily. Remove & Discard patch within 12 hours or as directed by MD    Dispense:  30 patch    Refill:  0    -I have reviewed the patients home medicines and have made adjustments as needed  Critical interventions none   Cardiac Monitoring: The patient was maintained on a cardiac monitor.  I personally viewed and interpreted the cardiac monitored which showed an underlying rhythm of: NSR  Social Determinants of Health:  Factors impacting patients care include: Works as a Pharmacist, hospital: After the interventions noted above, I reevaluated the patient and found that they have :improved  Co morbidities that complicate the patient evaluation  Past Medical History:  Diagnosis Date   Asthma       Dispostion: I considered admission for this patient, but at this time she does not meet inpatient criteria for admission and will be discharged with outpatient follow-up     Final Clinical Impression(s) / ED Diagnoses Final diagnoses:  Hypertension, unspecified type  Other fatigue  Cervical radiculopathy     @PCDICTATION @    Glendora Score, MD 07/02/23 [phone]

## 2023-10-19 IMAGING — CR DG CHEST 2V
2 series · 2 of 2 positions shown · non-contrast
Comparison: 04/26/2021

CLINICAL DATA: Chest pain.

EXAM:
CHEST - 2 VIEW

[w chest pa]
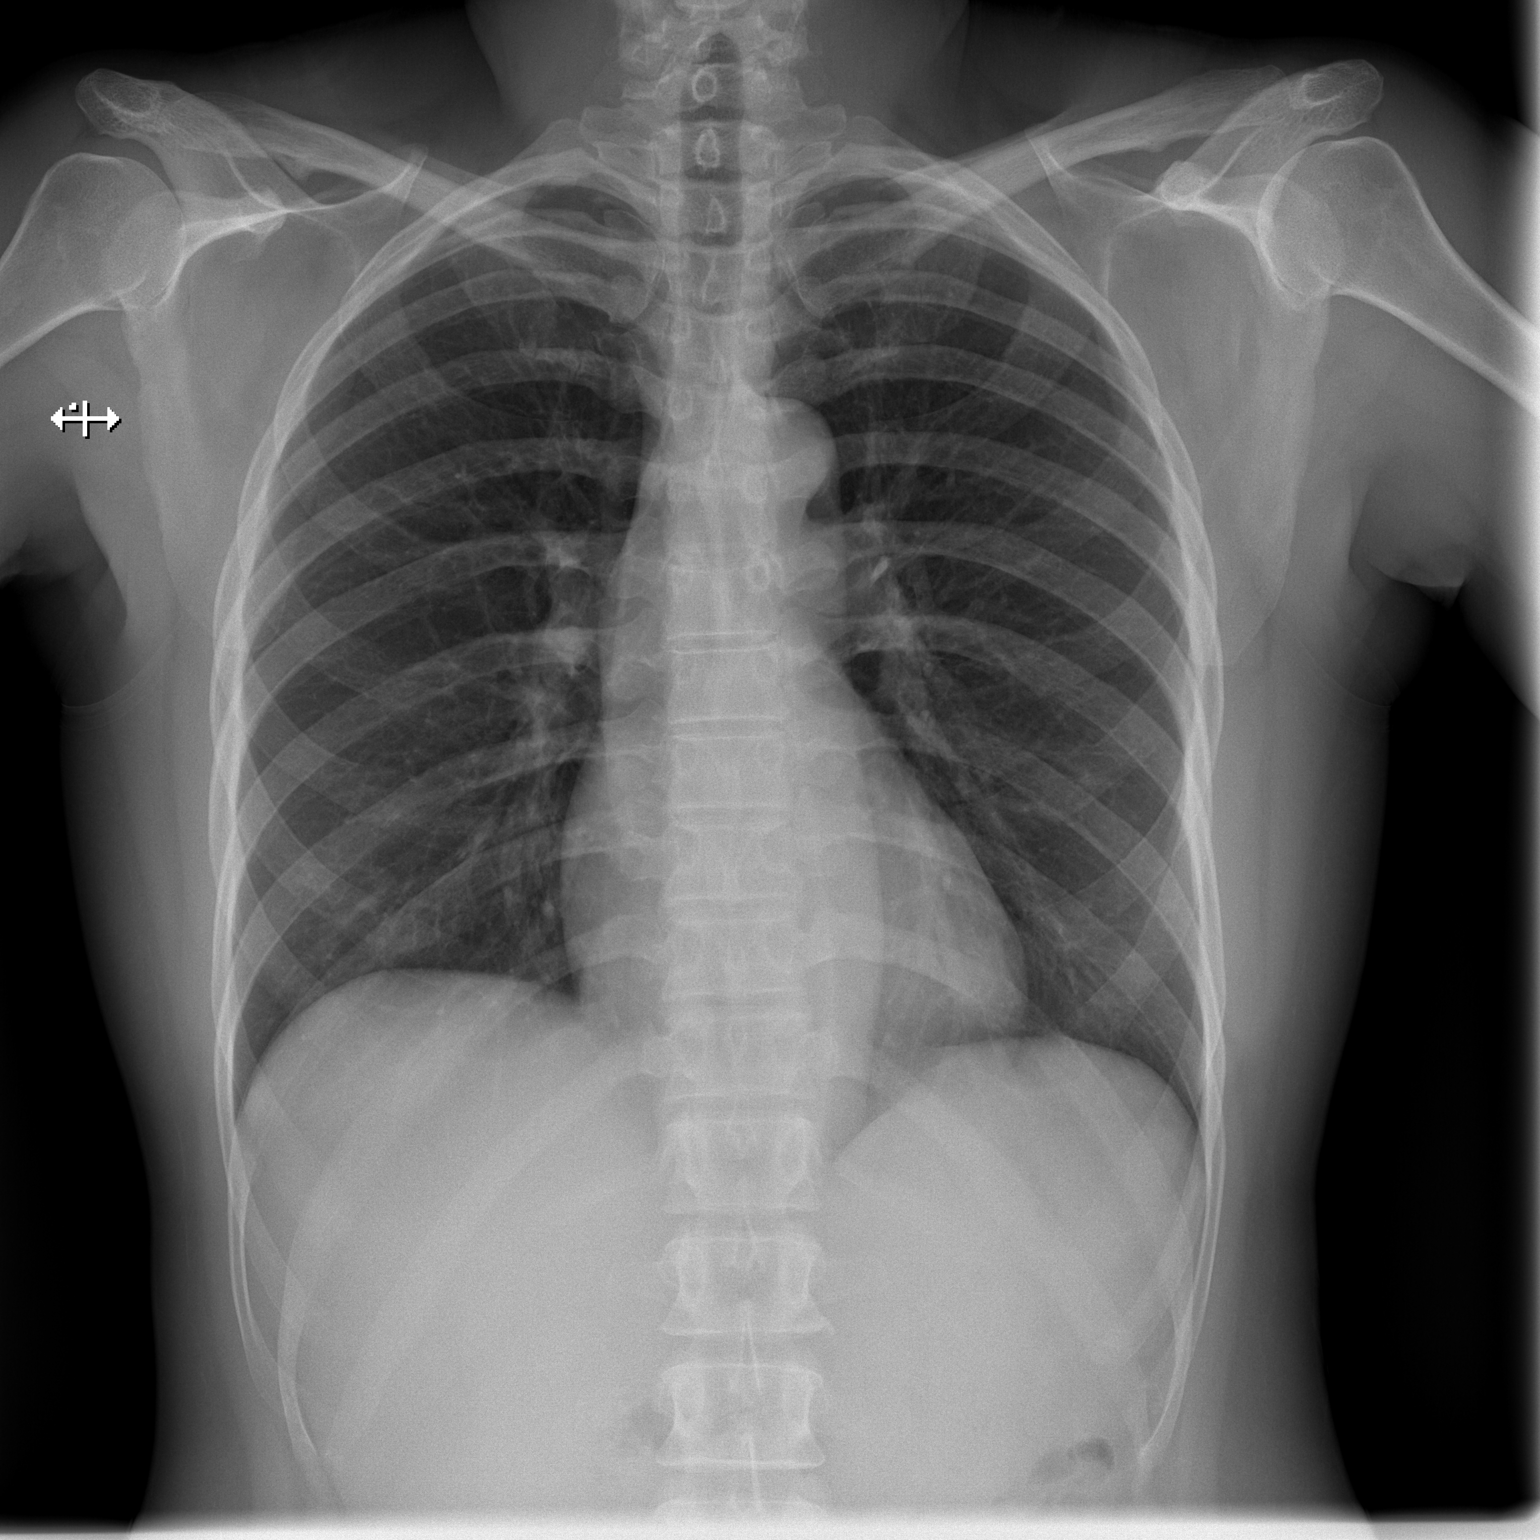

[w chest lat]
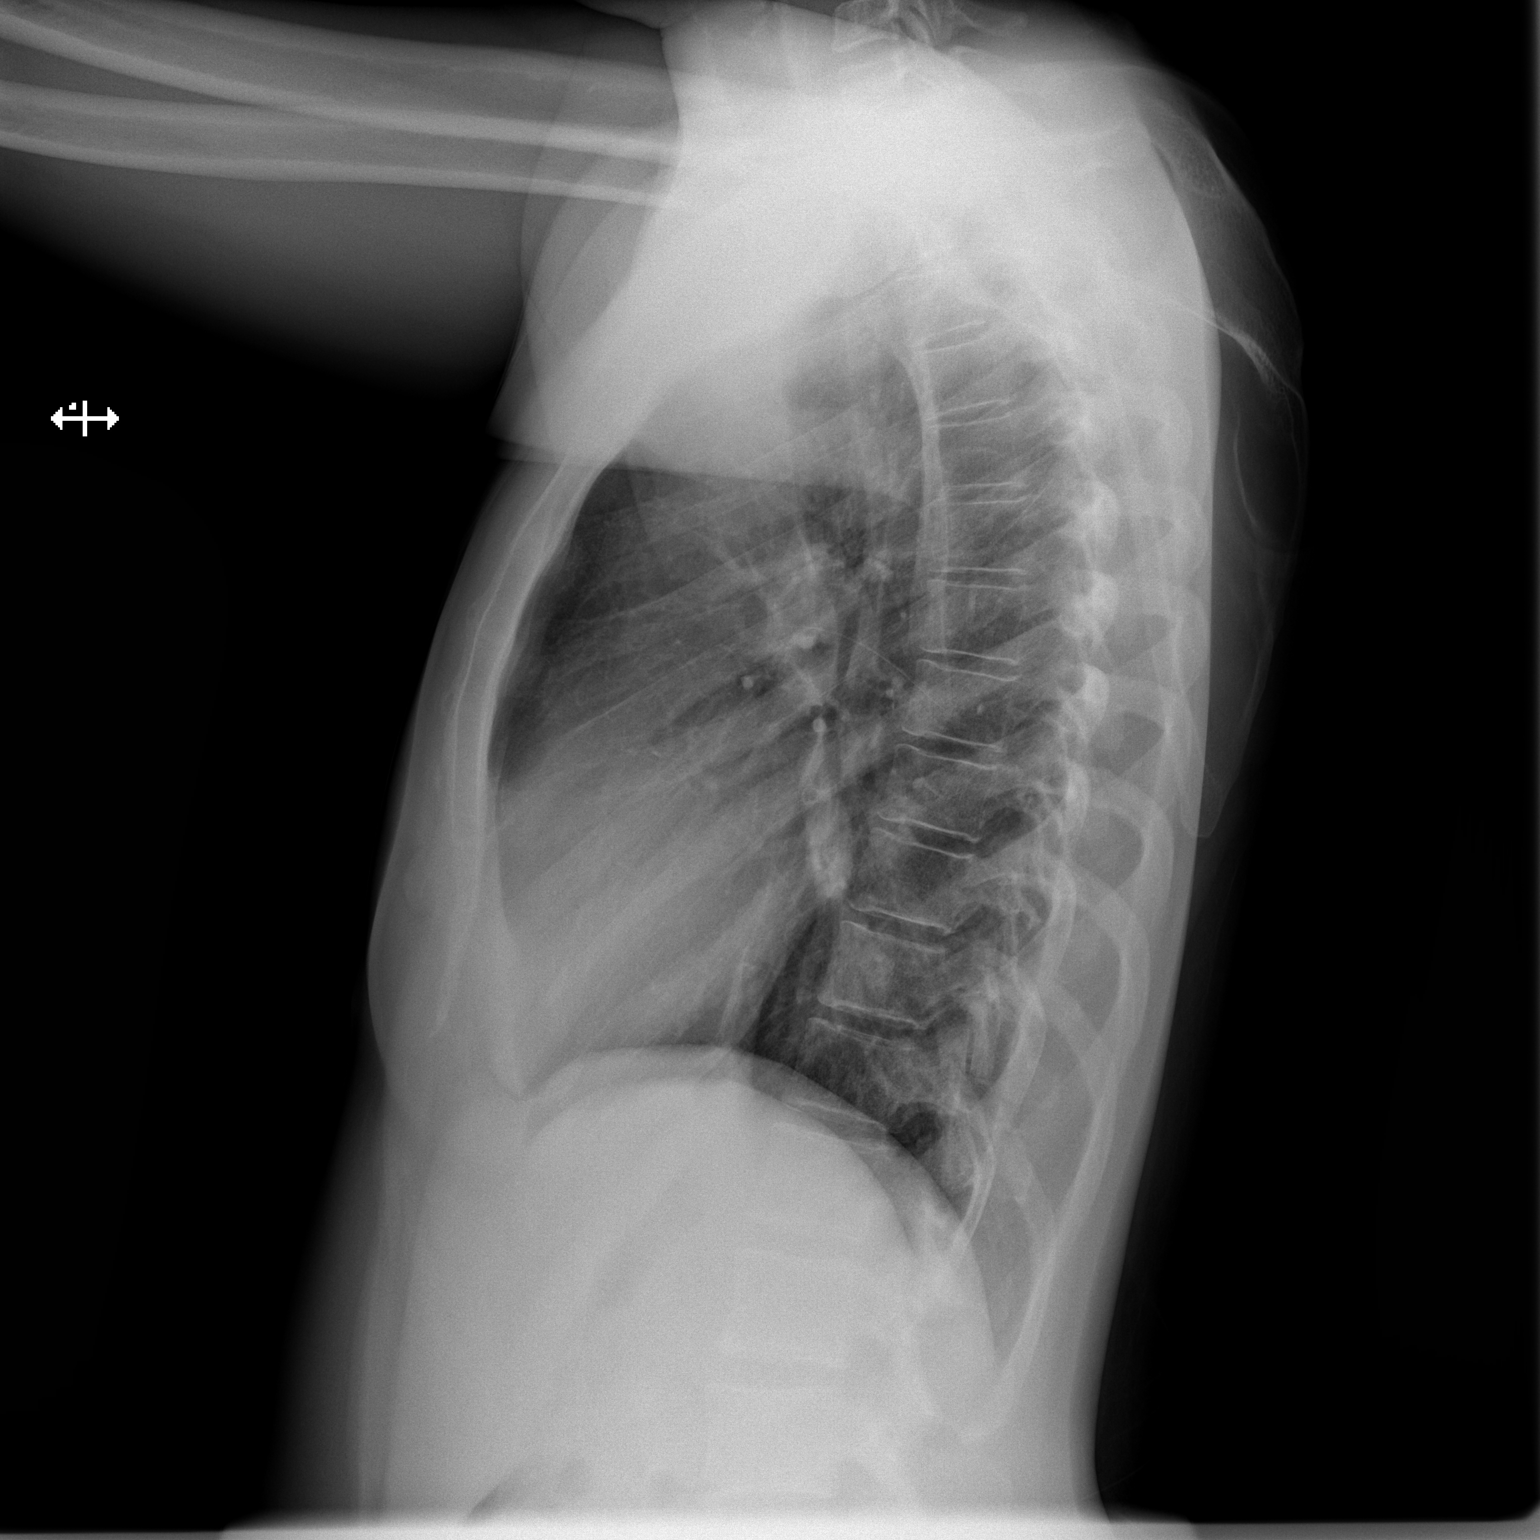

[2 of 2 positions shown; findings below may reference images not displayed]

FINDINGS: The cardiomediastinal contours are normal. The lungs are clear.
Pulmonary vasculature is normal. No consolidation, pleural effusion,
or pneumothorax. No acute osseous abnormalities are seen.
IMPRESSION: Negative radiographs of the chest.

## 2024-02-02 ENCOUNTER — Other Ambulatory Visit: Payer: Self-pay | Admitting: Family Medicine

## 2024-02-02 DIAGNOSIS — Z1231 Encounter for screening mammogram for malignant neoplasm of breast: Secondary | ICD-10-CM

## 2024-03-13 ENCOUNTER — Ambulatory Visit
Admission: RE | Admit: 2024-03-13 | Discharge: 2024-03-13 | Disposition: A | Discharge status: Home / Self Care | Payer: Self-pay | Source: Ambulatory Visit | Attending: Family Medicine | Admitting: Family Medicine

## 2024-03-13 DIAGNOSIS — Z1231 Encounter for screening mammogram for malignant neoplasm of breast: Secondary | ICD-10-CM
